# Patient Record
Sex: Male | Born: 1980 | Race: White | Hispanic: No | Smoking: Never smoker
Health system: Southern US, Community
[De-identification: ages and names within clinical notes are randomized; demographics above are authoritative.]

## PROBLEM LIST (undated history)

## (undated) DIAGNOSIS — I639 Cerebral infarction, unspecified: Secondary | ICD-10-CM

## (undated) DIAGNOSIS — I10 Essential (primary) hypertension: Secondary | ICD-10-CM

## (undated) HISTORY — PX: NO PAST SURGERIES: SHX2092

## (undated) HISTORY — DX: Cerebral infarction, unspecified: I63.9

---

## 2012-10-17 ENCOUNTER — Ambulatory Visit: Payer: Self-pay | Admitting: Family Medicine

## 2019-05-15 ENCOUNTER — Telehealth: Payer: Self-pay

## 2019-05-15 NOTE — Telephone Encounter (Signed)
Devesh came by for ibuprofen 21 packs with 2 tablets given (MooreBrand Ibuprofen 200 mg per tablet; Lot #[id number]; Exp: 10/12/2020)  2 Thermacare Heatwraps (Neck Pain Therapy) given. Lot #[id number]; Exp:  11/09/2020  AMD

## 2019-05-15 NOTE — Telephone Encounter (Signed)
Called C/O stiff neck for past couple of days- stating "feels like a slept on it wrong" .  Hasn't tried any OTC meds or anything else. Advised to try Ibuprofen 400 mg tid (with food); heat compress/heating pad for 15 minutes intermittently & warm showers.  Try these over the next 4-5 days & if no better, call back for an appointment for MD evaluation.  Verbalized understanding.  AMD

## 2019-08-21 ENCOUNTER — Emergency Department: Payer: 59

## 2019-08-21 ENCOUNTER — Encounter: Payer: Self-pay | Admitting: Emergency Medicine

## 2019-08-21 ENCOUNTER — Other Ambulatory Visit: Payer: Self-pay

## 2019-08-21 ENCOUNTER — Inpatient Hospital Stay (HOSPITAL_COMMUNITY)
Admission: AD | Admit: 2019-08-21 | Discharge: 2019-08-26 | DRG: 064 | Disposition: A | Discharge status: Home / Self Care | Payer: 59 | Source: Other Acute Inpatient Hospital | Attending: Neurology | Admitting: Neurology

## 2019-08-21 ENCOUNTER — Emergency Department
Admission: EM | Admit: 2019-08-21 | Discharge: 2019-08-21 | Disposition: A | Discharge status: Other Acute Inpatient Hospital | Payer: 59 | Attending: Emergency Medicine | Admitting: Emergency Medicine

## 2019-08-21 DIAGNOSIS — Z20828 Contact with and (suspected) exposure to other viral communicable diseases: Secondary | ICD-10-CM | POA: Diagnosis not present

## 2019-08-21 DIAGNOSIS — E782 Mixed hyperlipidemia: Secondary | ICD-10-CM | POA: Diagnosis not present

## 2019-08-21 DIAGNOSIS — Z6829 Body mass index (BMI) 29.0-29.9, adult: Secondary | ICD-10-CM | POA: Diagnosis not present

## 2019-08-21 DIAGNOSIS — I160 Hypertensive urgency: Secondary | ICD-10-CM | POA: Insufficient documentation

## 2019-08-21 DIAGNOSIS — R402252 Coma scale, best verbal response, oriented, at arrival to emergency department: Secondary | ICD-10-CM | POA: Diagnosis present

## 2019-08-21 DIAGNOSIS — R270 Ataxia, unspecified: Secondary | ICD-10-CM | POA: Diagnosis not present

## 2019-08-21 DIAGNOSIS — F17220 Nicotine dependence, chewing tobacco, uncomplicated: Secondary | ICD-10-CM | POA: Diagnosis present

## 2019-08-21 DIAGNOSIS — F1010 Alcohol abuse, uncomplicated: Secondary | ICD-10-CM | POA: Diagnosis present

## 2019-08-21 DIAGNOSIS — J3489 Other specified disorders of nose and nasal sinuses: Secondary | ICD-10-CM | POA: Diagnosis not present

## 2019-08-21 DIAGNOSIS — I639 Cerebral infarction, unspecified: Secondary | ICD-10-CM | POA: Diagnosis present

## 2019-08-21 DIAGNOSIS — E669 Obesity, unspecified: Secondary | ICD-10-CM | POA: Diagnosis not present

## 2019-08-21 DIAGNOSIS — I6389 Other cerebral infarction: Secondary | ICD-10-CM | POA: Diagnosis not present

## 2019-08-21 DIAGNOSIS — I161 Hypertensive emergency: Secondary | ICD-10-CM | POA: Diagnosis not present

## 2019-08-21 DIAGNOSIS — R29700 NIHSS score 0: Secondary | ICD-10-CM | POA: Diagnosis present

## 2019-08-21 DIAGNOSIS — R519 Headache, unspecified: Secondary | ICD-10-CM | POA: Diagnosis not present

## 2019-08-21 DIAGNOSIS — G936 Cerebral edema: Secondary | ICD-10-CM | POA: Diagnosis not present

## 2019-08-21 DIAGNOSIS — R402142 Coma scale, eyes open, spontaneous, at arrival to emergency department: Secondary | ICD-10-CM | POA: Diagnosis present

## 2019-08-21 DIAGNOSIS — I7774 Dissection of vertebral artery: Secondary | ICD-10-CM | POA: Diagnosis present

## 2019-08-21 DIAGNOSIS — R42 Dizziness and giddiness: Secondary | ICD-10-CM | POA: Diagnosis not present

## 2019-08-21 DIAGNOSIS — F17200 Nicotine dependence, unspecified, uncomplicated: Secondary | ICD-10-CM | POA: Diagnosis not present

## 2019-08-21 DIAGNOSIS — E7800 Pure hypercholesterolemia, unspecified: Secondary | ICD-10-CM | POA: Diagnosis not present

## 2019-08-21 DIAGNOSIS — I63431 Cerebral infarction due to embolism of right posterior cerebral artery: Secondary | ICD-10-CM | POA: Diagnosis not present

## 2019-08-21 DIAGNOSIS — E785 Hyperlipidemia, unspecified: Secondary | ICD-10-CM | POA: Diagnosis present

## 2019-08-21 DIAGNOSIS — I63211 Cerebral infarction due to unspecified occlusion or stenosis of right vertebral arteries: Secondary | ICD-10-CM | POA: Diagnosis not present

## 2019-08-21 DIAGNOSIS — R402362 Coma scale, best motor response, obeys commands, at arrival to emergency department: Secondary | ICD-10-CM | POA: Diagnosis present

## 2019-08-21 DIAGNOSIS — E538 Deficiency of other specified B group vitamins: Secondary | ICD-10-CM | POA: Diagnosis not present

## 2019-08-21 DIAGNOSIS — Z823 Family history of stroke: Secondary | ICD-10-CM | POA: Diagnosis not present

## 2019-08-21 DIAGNOSIS — I10 Essential (primary) hypertension: Secondary | ICD-10-CM | POA: Diagnosis not present

## 2019-08-21 DIAGNOSIS — I6300 Cerebral infarction due to thrombosis of unspecified precerebral artery: Secondary | ICD-10-CM | POA: Diagnosis not present

## 2019-08-21 DIAGNOSIS — R69 Illness, unspecified: Secondary | ICD-10-CM | POA: Diagnosis not present

## 2019-08-21 DIAGNOSIS — Z8249 Family history of ischemic heart disease and other diseases of the circulatory system: Secondary | ICD-10-CM

## 2019-08-21 DIAGNOSIS — I63233 Cerebral infarction due to unspecified occlusion or stenosis of bilateral carotid arteries: Secondary | ICD-10-CM | POA: Diagnosis not present

## 2019-08-21 HISTORY — DX: Essential (primary) hypertension: I10

## 2019-08-21 LAB — COMPREHENSIVE METABOLIC PANEL WITH GFR
ALT: 43 U/L (ref 0–44)
AST: 25 U/L (ref 15–41)
Albumin: 4.5 g/dL (ref 3.5–5.0)
Alkaline Phosphatase: 52 U/L (ref 38–126)
Anion gap: 11 (ref 5–15)
BUN: 19 mg/dL (ref 6–20)
CO2: 25 mmol/L (ref 22–32)
Calcium: 9.3 mg/dL (ref 8.9–10.3)
Chloride: 100 mmol/L (ref 98–111)
Creatinine, Ser: 1.18 mg/dL (ref 0.61–1.24)
GFR calc Af Amer: >60 mL/min
GFR calc non Af Amer: >60 mL/min
Glucose, Bld: 103 mg/dL — ABNORMAL HIGH (ref 70–99)
Potassium: 4.1 mmol/L (ref 3.5–5.1)
Sodium: 136 mmol/L (ref 135–145)
Total Bilirubin: 0.8 mg/dL (ref 0.3–1.2)
Total Protein: 7.6 g/dL (ref 6.5–8.1)

## 2019-08-21 LAB — TROPONIN I (HIGH SENSITIVITY)
Troponin I (High Sensitivity): 4 ng/L (ref <18)
Troponin I (High Sensitivity): 3 ng/L (ref <18)

## 2019-08-21 LAB — CBC
HCT: 42.5 % (ref 39.0–52.0)
Hemoglobin: 14.8 g/dL (ref 13.0–17.0)
MCH: 30.3 pg (ref 26.0–34.0)
MCHC: 34.8 g/dL (ref 30.0–36.0)
MCV: 86.9 fL (ref 80.0–100.0)
Platelets: 301 10*3/uL (ref 150–400)
RBC: 4.89 MIL/uL (ref 4.22–5.81)
RDW: 12.3 % (ref 11.5–15.5)
WBC: 8.4 10*3/uL (ref 4.0–10.5)
nRBC: 0 % (ref 0.0–0.2)

## 2019-08-21 LAB — HEMOGLOBIN A1C
Hgb A1c MFr Bld: 5.9 % — ABNORMAL HIGH (ref 4.8–5.6)
Mean Plasma Glucose: 122.63 mg/dL

## 2019-08-21 LAB — LIPID PANEL
Cholesterol: 257 mg/dL — ABNORMAL HIGH (ref 0–200)
HDL: 42 mg/dL
LDL Cholesterol: 180 mg/dL — ABNORMAL HIGH (ref 0–99)
Total CHOL/HDL Ratio: 6.1 ratio
Triglycerides: 176 mg/dL — ABNORMAL HIGH
VLDL: 35 mg/dL (ref 0–40)

## 2019-08-21 LAB — POC SARS CORONAVIRUS 2 AG: SARS Coronavirus 2 Ag: NEGATIVE

## 2019-08-21 MED ORDER — ACETAMINOPHEN 650 MG RE SUPP: 650.0000 mg | RECTAL | Status: DC | PRN

## 2019-08-21 MED ORDER — OXYCODONE HCL 5 MG PO TABS: 5.0000 mg | ORAL_TABLET | ORAL | Status: DC | PRN

## 2019-08-21 MED ORDER — ASPIRIN 300 MG RE SUPP: 300.0000 mg | Freq: Every day | RECTAL | Status: DC

## 2019-08-21 MED ORDER — LABETALOL HCL 5 MG/ML IV SOLN
5.0000 mg | INTRAVENOUS | Status: DC | PRN
  Administered 2019-08-21: 5 mg via INTRAVENOUS
  Filled 2019-08-21: qty 4

## 2019-08-21 MED ORDER — SODIUM CHLORIDE 0.9 % IV SOLN: INTRAVENOUS | Status: DC

## 2019-08-21 MED ORDER — THIAMINE HCL 100 MG/ML IJ SOLN: 100.0000 mg | Freq: Every day | INTRAMUSCULAR | Status: DC

## 2019-08-21 MED ORDER — ACETAMINOPHEN 160 MG/5ML PO SOLN: 650.0000 mg | ORAL | Status: DC | PRN

## 2019-08-21 MED ORDER — STROKE: EARLY STAGES OF RECOVERY BOOK
Freq: Once | Status: AC
  Administered 2019-08-22
  Filled 2019-08-21: qty 1

## 2019-08-21 MED ORDER — ASPIRIN EC 325 MG PO TBEC: 975.0000 mg | DELAYED_RELEASE_TABLET | Freq: Every day | ORAL | Status: DC

## 2019-08-21 MED ORDER — ACETAMINOPHEN 325 MG PO TABS
650.0000 mg | ORAL_TABLET | ORAL | Status: DC | PRN
  Administered 2019-08-23 (×2): 650 mg via ORAL
  Filled 2019-08-21 (×2): qty 2

## 2019-08-21 MED ORDER — LORAZEPAM 1 MG PO TABS: 1.0000 mg | ORAL_TABLET | ORAL | Status: DC | PRN

## 2019-08-21 MED ORDER — FOLIC ACID 1 MG PO TABS
1.0000 mg | ORAL_TABLET | Freq: Every day | ORAL | Status: DC
  Administered 2019-08-21: 1 mg via ORAL
  Filled 2019-08-21: qty 1

## 2019-08-21 MED ORDER — ASPIRIN EC 325 MG PO TBEC: 325.0000 mg | DELAYED_RELEASE_TABLET | Freq: Every day | ORAL | Status: DC

## 2019-08-21 MED ORDER — SODIUM CHLORIDE 3 % IV SOLN
INTRAVENOUS | Status: AC
  Administered 2019-08-22 (×3): 75 mL/h via INTRAVENOUS
  Filled 2019-08-21 (×5): qty 500

## 2019-08-21 MED ORDER — GADOBUTROL 1 MMOL/ML IV SOLN
7.5000 mL | Freq: Once | INTRAVENOUS | Status: AC | PRN
  Administered 2019-08-21: 7.5 mL via INTRAVENOUS
  Filled 2019-08-21: qty 7.5

## 2019-08-21 MED ORDER — ASPIRIN 81 MG PO CHEW
324.0000 mg | CHEWABLE_TABLET | Freq: Once | ORAL | Status: AC
  Administered 2019-08-21: 324 mg via ORAL
  Filled 2019-08-21: qty 4

## 2019-08-21 MED ORDER — ASPIRIN 325 MG PO TABS
325.0000 mg | ORAL_TABLET | Freq: Every day | ORAL | Status: DC
  Administered 2019-08-22 – 2019-08-26 (×5): 325 mg via ORAL
  Filled 2019-08-21 (×5): qty 1

## 2019-08-21 MED ORDER — VITAMIN B-1 100 MG PO TABS
100.0000 mg | ORAL_TABLET | Freq: Every day | ORAL | Status: DC
  Administered 2019-08-21: 100 mg via ORAL
  Filled 2019-08-21: qty 1

## 2019-08-21 MED ORDER — AMLODIPINE BESYLATE 5 MG PO TABS
2.5000 mg | ORAL_TABLET | Freq: Every day | ORAL | Status: DC
  Administered 2019-08-21: 2.5 mg via ORAL

## 2019-08-21 MED ORDER — LORAZEPAM 2 MG/ML IJ SOLN
1.0000 mg | INTRAMUSCULAR | Status: DC | PRN
  Administered 2019-08-21: 1 mg via INTRAVENOUS
  Filled 2019-08-21: qty 1

## 2019-08-21 MED ORDER — ATORVASTATIN CALCIUM 20 MG PO TABS
40.0000 mg | ORAL_TABLET | Freq: Every day | ORAL | Status: DC
  Administered 2019-08-21: 40 mg via ORAL
  Filled 2019-08-21: qty 2

## 2019-08-21 MED ORDER — ALPRAZOLAM 0.5 MG PO TABS
1.0000 mg | ORAL_TABLET | Freq: Once | ORAL | Status: AC
  Administered 2019-08-21: 1 mg via ORAL
  Filled 2019-08-21: qty 2

## 2019-08-21 MED ORDER — CLOPIDOGREL BISULFATE 75 MG PO TABS
75.0000 mg | ORAL_TABLET | Freq: Every day | ORAL | Status: DC
  Administered 2019-08-22 – 2019-08-26 (×5): 75 mg via ORAL
  Filled 2019-08-21 (×6): qty 1

## 2019-08-21 MED ORDER — CLOPIDOGREL BISULFATE 300 MG PO TABS
300.0000 mg | ORAL_TABLET | Freq: Once | ORAL | Status: AC
  Administered 2019-08-22: 300 mg via ORAL
  Filled 2019-08-21: qty 1

## 2019-08-21 MED ORDER — ADULT MULTIVITAMIN W/MINERALS CH
1.0000 | ORAL_TABLET | Freq: Every day | ORAL | Status: DC
  Administered 2019-08-21: 1 via ORAL
  Filled 2019-08-21: qty 1

## 2019-08-21 NOTE — ED Notes (Signed)
Late entry.  Swallow study done at 1140, prior to medication administration.

## 2019-08-21 NOTE — ED Provider Notes (Signed)
[facility] Emergency Department Provider Note  ____________________________________________   First MD Initiated Contact with Patient 08/21/19 1015     (approximate)  I have reviewed the triage vital signs and the nursing notes.   HISTORY  Chief Complaint Headache   HPI Henry Powell is a 38 y.o. male presents to the ED with complaint plaint of posterior and left-sided headache for approximately 3 days.  Patient states he has taken over-the-counter medication without any relief of his headache.  He denies any photophobia, visual disturbance, nausea, vomiting, chest pain or shortness of breath.  He states that his doctor discontinued his blood pressure medication 10 years ago when he was eating well and exercising on a routine basis.  Patient states he had a "dizzy spell" and was seen at [facility] urgent care on Sunday.  He has lab work with him and was told that he was okay and that his blood pressure came down while he was there.      Past Medical History:  Diagnosis Date  . Hypertension     Patient Active Problem List   Diagnosis Date Noted  . Cerebellar stroke, acute (HCC)   . Hypertensive urgency   . Alcohol abuse     Past Surgical History:  Procedure Laterality Date  . NO PAST SURGERIES      Prior to Admission medications   Not on File    Allergies Patient has no allergy information on record.  Family History  Problem Relation Age of Onset  . Hypertension Mother   . Diabetes Mother   . Hypertension Father     Social History Social History   Tobacco Use  . Smoking status: Never Smoker  . Smokeless tobacco: Current User    Types: Chew  Substance Use Topics  . Alcohol use: Yes    Alcohol/week: 56.0 standard drinks    Types: 56 Cans of beer per week  . Drug use: Never    Review of Systems Constitutional: No fever/chills Eyes: No visual changes. ENT: No sore throat. Cardiovascular: Denies chest pain. Respiratory: Denies  shortness of breath. Gastrointestinal: No abdominal pain.  No nausea, no vomiting. Musculoskeletal: Negative for back pain. Skin: Negative for rash. Neurological: Positive for headache, focal weakness or numbness. ____________________________________________   PHYSICAL EXAM:  VITAL SIGNS: ED Triage Vitals  Enc Vitals Group     BP 08/21/19 0958 (!) 184/114     Pulse Rate 08/21/19 0958 78     Resp 08/21/19 0958 18     Temp 08/21/19 0958 98.6 F (37 C)     Temp Source 08/21/19 0958 Oral     SpO2 08/21/19 0958 100 %     Weight 08/21/19 0955 185 lb (83.9 kg)     Height 08/21/19 0955 5\' 8"  (1.727 m)     Head Circumference --      Peak Flow --      Pain Score 08/21/19 0955 5     Pain Loc --      Pain Edu? --      Excl. in GC? --     Constitutional: Alert and oriented. Well appearing and in no acute distress.  Talkative and answering questions appropriately. Eyes: Conjunctivae are normal. PERRL. EOMI. Head: Atraumatic. Neck: No stridor.   Cardiovascular: Normal rate, regular rhythm. Grossly normal heart sounds.  Good peripheral circulation. Respiratory: Normal respiratory effort.  No retractions. Lungs CTAB. Musculoskeletal: Moves upper and lower extremities no difficulty.  Normal gait was noted.  Good muscle  strength bilaterally. Neurologic:  Normal speech and language. No gross focal neurologic deficits are appreciated.  Cranial nerves II through XII grossly intact.  No gait instability. Skin:  Skin is warm, dry and intact. No rash noted. Psychiatric: Mood and affect are normal. Speech and behavior are normal.  ____________________________________________   LABS (all labs ordered are listed, but only abnormal results are displayed)  Labs Reviewed  COMPREHENSIVE METABOLIC PANEL - Abnormal; Notable for the following components:      Result Value   Glucose, Bld 103 (*)    All other components within normal limits  SARS CORONAVIRUS 2 (TAT 6-24 HRS)  CBC  LIPID PANEL   HEMOGLOBIN A1C  TROPONIN I (HIGH SENSITIVITY)  TROPONIN I (HIGH SENSITIVITY)   ____________________________________________  EKG   ____________________________________________  RADIOLOGY  Official radiology report(s): Dg Chest 2 View  Result Date: 08/21/2019 CLINICAL DATA:  Hypertension. EXAM: CHEST - 2 VIEW COMPARISON:  None. FINDINGS: The cardiomediastinal silhouette is within normal limits. The lungs are well inflated and clear. There is no evidence of pleural effusion or pneumothorax. No acute osseous abnormality is identified. IMPRESSION: No active cardiopulmonary disease. Electronically Signed   By: Sebastian AcheAllen  Grady M.D.   On: 08/21/2019 13:58   Ct Head Wo Contrast  Result Date: 08/21/2019 CLINICAL DATA:  Headache and dizziness for 3-4 days. EXAM: CT HEAD WITHOUT CONTRAST TECHNIQUE: Contiguous axial images were obtained from the base of the skull through the vertex without intravenous contrast. COMPARISON:  None. FINDINGS: Brain: A round hypointense lesion in the right cerebellum measures 2.7 cm craniocaudal by 3.6 cm AP by 3.9 cm transverse. No hemorrhage is identified. There is no hydrocephalus or midline shift. Vascular: No hyperdense vessel or unexpected calcification. Skull: Intact.  No focal lesion. Sinuses/Orbits: None. Other: None. IMPRESSION: Lesion in the right cerebellum could be an infarct or a benign entity such as an arachnoid cyst or epidermoid cyst but cannot be definitively characterized. Brain MRI with and without contrast is recommended for further evaluation. These results were called by telephone at the time of interpretation on 08/21/2019 at 11:18 am to provider [facility] Mykael Trott, P.A., who verbally acknowledged these results. Electronically Signed   By: Drusilla Kannerhomas  Dalessio M.D.   On: 08/21/2019 11:23   Mr Laqueta JeanBrain W And Wo Contrast  Result Date: 08/21/2019 CLINICAL DATA:  Acute onset of headache and dizziness for 3 days. Abnormal head CT. EXAM: MRI HEAD WITHOUT AND WITH  CONTRAST TECHNIQUE: Multiplanar, multiecho pulse sequences of the brain and surrounding structures were obtained without and with intravenous contrast. CONTRAST:  7.755mL GADAVIST GADOBUTROL 1 MMOL/ML IV SOLN COMPARISON:  Head CT same day FINDINGS: Brain: There is acute infarction of the inferior right cerebellum in PICA distribution. The area of infarction shows swelling but there is no hemorrhage. Brain otherwise appears normal without evidence of other old small or large vessel infarction. No mass lesion, hydrocephalus or extra-axial collection. After contrast administration, no abnormal enhancement occurs. Vascular: Major vessels at the base of the brain show flow. Skull and upper cervical spine: Negative Sinuses/Orbits: Clear/normal.  Hypoplastic right maxillary sinus. Other: None IMPRESSION: Acute infarction affecting the inferior cerebellum on the right, PICA distribution. Swelling but no hemorrhagic transformation. Non dominant right vertebral artery appears to show at least some flow. Electronically Signed   By: Paulina FusiMark  Shogry M.D.   On: 08/21/2019 12:42    ____________________________________________   PROCEDURES  Procedure(s) performed (including Critical Care):  Procedures ____________________________________________   INITIAL IMPRESSION / ASSESSMENT AND PLAN / ED COURSE  As part of my medical decision making, I reviewed the following data within the electronic MEDICAL RECORD NUMBER Notes from prior ED visits and  Controlled Substance Database  38 year old male presents to the ED with complaint of left-sided headache for 3 days.  Patient states he has been taking Tylenol without any relief of his headache.  He denies any injury, previous headaches, visual disturbance, nausea, vomiting or photophobia.  He states he had "a dizzy spell" and was seen at [facility] urgent care 4 days ago and had lab work and was told he was okay.  Patient has not been on any antihypertensives in the last 10 years as  he lost weight and was exercising his doctor discontinued his medication.  Physical exam was unremarkable.  MRI showed an acute ischemic area with some edema involving the right inferior cerebellum.  Hospitalist and neurosurgery was consulted.  It was decided that patient would be transferred.  Dr. Kerman Passey currently is working on transfer.  ____________________________________________   FINAL CLINICAL IMPRESSION(S) / ED DIAGNOSES  Final diagnoses:  Ischemic stroke ([place])  Hypertension, uncontrolled     ED Discharge Orders    None       Note:  This document was prepared using Dragon voice recognition software and may include unintentional dictation errors.    Johnn Hai, PA-C 08/21/19 1509    Harvest Dark, MD 08/21/19 2048

## 2019-08-21 NOTE — ED Notes (Signed)
Patient is asking to eat; advised per this RN to remain NPO until MRI results are back.

## 2019-08-21 NOTE — ED Notes (Signed)
Return from CT.  AAOx3.  Skin warm and dry. NAD 

## 2019-08-21 NOTE — ED Notes (Signed)
Patient transported to MRI 

## 2019-08-21 NOTE — ED Triage Notes (Signed)
Pt hypertensive in triage. Pt reports was on BP meds a long time ago and then stopped taking them. Advised patient to follow up with PMD due to elevated BP

## 2019-08-21 NOTE — ED Notes (Signed)
Report called to Company secretary at [facility]. Patient to be transferred to room 22 by [facility].

## 2019-08-21 NOTE — ED Notes (Signed)
Neurology to bedside

## 2019-08-21 NOTE — H&P (Signed)
[place] [facility] [place] NAME: Henry Powell    MR#:  [id number]  DATE OF BIRTH:  [birth year]  DATE OF ADMISSION:  08/21/2019  PRIMARY CARE PHYSICIAN: [facility] [place]   REQUESTING/REFERRING PHYSICIAN: Dr Harvest Dark  CHIEF COMPLAINT:   Chief Complaint  Patient presents with  . Headache    HISTORY OF PRESENT ILLNESS:  Henry Powell  is a 38 y.o. male coming in with 4-day history of headache.  He was sweeping up some chemical at work he felt lightheaded and almost passed out.  He needed to sit down.  He has been sleeping a lot and resting over the last few days.  He went to the urgent care on Sunday because of dizziness and the headache persisting.  Today he bent over and tried to come back up and almost fell over.  He came to the ER for further evaluation.  In the ER he had a MRI that showed acute infarction affecting the inferior cerebellum on the right PICA distribution with swelling.  Hospitalist services were contacted for evaluation.  PAST MEDICAL HISTORY:   Past Medical History:  Diagnosis Date  . Hypertension     PAST SURGICAL HISTORY:   Past Surgical History:  Procedure Laterality Date  . NO PAST SURGERIES      SOCIAL HISTORY:   Social History   Tobacco Use  . Smoking status: Never Smoker  . Smokeless tobacco: Current User    Types: Chew  Substance Use Topics  . Alcohol use: Yes    Alcohol/week: 56.0 standard drinks    Types: 56 Cans of beer per week    FAMILY HISTORY:   Family History  Problem Relation Age of Onset  . Hypertension Mother   . Diabetes Mother   . Hypertension Father     DRUG ALLERGIES:  Not on File  REVIEW OF SYSTEMS:  CONSTITUTIONAL: No fever, chills or sweats.  Positive for fatigue and generalized weakness.  EYES: No blurred or double vision.  EARS, NOSE, AND THROAT: No tinnitus or ear pain. No sore throat.  Positive for nasal congestion RESPIRATORY: No cough.some  shortness of breath, no wheezing or hemoptysis.  CARDIOVASCULAR: Occasional chest pain. no orthopnea, edema.  GASTROINTESTINAL: No nausea, vomiting, diarrhea or abdominal pain. No blood in bowel movements GENITOURINARY: No dysuria, hematuria.  ENDOCRINE: No polyuria, nocturia,  HEMATOLOGY: No anemia, easy bruising or bleeding SKIN: No rash or lesion. MUSCULOSKELETAL: No joint pain or arthritis.   NEUROLOGIC: No tingling, numbness, weakness.  PSYCHIATRY: No anxiety or depression.   MEDICATIONS AT HOME:   Prior to Admission medications   Not on File   Patient does not take any medications  VITAL SIGNS:  Blood pressure (!) 150/119, pulse 71, temperature 98.6 F (37 C), temperature source Oral, resp. rate 16, height 5\' 8"  (1.727 m), weight 83.9 kg, SpO2 100 %.  PHYSICAL EXAMINATION:  GENERAL:  38 y.o.-year-old patient lying in the bed with no acute distress.  EYES: Pupils equal, round, reactive to light and accommodation. No scleral icterus. Extraocular muscles intact.  HEENT: Head atraumatic, normocephalic. Oropharynx and nasopharynx clear.  NECK:  Supple, no jugular venous distention. No thyroid enlargement, no tenderness.  LUNGS: Normal breath sounds bilaterally, no wheezing, rales,rhonchi or crepitation. No use of accessory muscles of respiration.  CARDIOVASCULAR: S1, S2 normal. No murmurs, rubs, or gallops.  ABDOMEN: Soft, nontender, nondistended. Bowel sounds present. No organomegaly or mass.  EXTREMITIES: No pedal edema, cyanosis, or clubbing.  NEUROLOGIC:  Cranial nerves II through XII are intact. Muscle strength 5/5 in all extremities. Sensation intact. Gait not checked.  Finger-nose intact bilaterally.  Heel shin intact bilaterally. PSYCHIATRIC: The patient is alert and oriented x 3.  SKIN: No rash, lesion, or ulcer.   LABORATORY PANEL:   CBC Recent Labs  Lab 08/21/19 1333  WBC 8.4  HGB 14.8  HCT 42.5  PLT 301    ------------------------------------------------------------------------------------------------------------------  Chemistries  Recent Labs  Lab 08/21/19 1333  NA 136  K 4.1  CL 100  CO2 25  GLUCOSE 103*  BUN 19  CREATININE 1.18  CALCIUM 9.3  AST 25  ALT 43  ALKPHOS 52  BILITOT 0.8   ------------------------------------------------------------------------------------------------------------------    RADIOLOGY:  Dg Chest 2 View  Result Date: 08/21/2019 CLINICAL DATA:  Hypertension. EXAM: CHEST - 2 VIEW COMPARISON:  None. FINDINGS: The cardiomediastinal silhouette is within normal limits. The lungs are well inflated and clear. There is no evidence of pleural effusion or pneumothorax. No acute osseous abnormality is identified. IMPRESSION: No active cardiopulmonary disease. Electronically Signed   By: Sebastian AcheAllen  Grady M.D.   On: 08/21/2019 13:58   Ct Head Wo Contrast  Result Date: 08/21/2019 CLINICAL DATA:  Headache and dizziness for 3-4 days. EXAM: CT HEAD WITHOUT CONTRAST TECHNIQUE: Contiguous axial images were obtained from the base of the skull through the vertex without intravenous contrast. COMPARISON:  None. FINDINGS: Brain: A round hypointense lesion in the right cerebellum measures 2.7 cm craniocaudal by 3.6 cm AP by 3.9 cm transverse. No hemorrhage is identified. There is no hydrocephalus or midline shift. Vascular: No hyperdense vessel or unexpected calcification. Skull: Intact.  No focal lesion. Sinuses/Orbits: None. Other: None. IMPRESSION: Lesion in the right cerebellum could be an infarct or a benign entity such as an arachnoid cyst or epidermoid cyst but cannot be definitively characterized. Brain MRI with and without contrast is recommended for further evaluation. These results were called by telephone at the time of interpretation on 08/21/2019 at 11:18 am to provider [facility] SUMMERS, P.A., who verbally acknowledged these results. Electronically Signed   By: Drusilla Kannerhomas   Dalessio M.D.   On: 08/21/2019 11:23   Mr Laqueta JeanBrain W And Wo Contrast  Result Date: 08/21/2019 CLINICAL DATA:  Acute onset of headache and dizziness for 3 days. Abnormal head CT. EXAM: MRI HEAD WITHOUT AND WITH CONTRAST TECHNIQUE: Multiplanar, multiecho pulse sequences of the brain and surrounding structures were obtained without and with intravenous contrast. CONTRAST:  7.575mL GADAVIST GADOBUTROL 1 MMOL/ML IV SOLN COMPARISON:  Head CT same day FINDINGS: Brain: There is acute infarction of the inferior right cerebellum in PICA distribution. The area of infarction shows swelling but there is no hemorrhage. Brain otherwise appears normal without evidence of other old small or large vessel infarction. No mass lesion, hydrocephalus or extra-axial collection. After contrast administration, no abnormal enhancement occurs. Vascular: Major vessels at the base of the brain show flow. Skull and upper cervical spine: Negative Sinuses/Orbits: Clear/normal.  Hypoplastic right maxillary sinus. Other: None IMPRESSION: Acute infarction affecting the inferior cerebellum on the right, PICA distribution. Swelling but no hemorrhagic transformation. Non dominant right vertebral artery appears to show at least some flow. Electronically Signed   By: Paulina FusiMark  Shogry M.D.   On: 08/21/2019 12:42    EKG:   Ordered by me  IMPRESSION AND PLAN:   1.  Acute cerebellar CVA on the right with edema.  Case discussed with neurology.  He recommended tertiary care center evaluation.  Okay with aspirin for now.  Will send off hemoglobin A1c and lipid profile.  Start Lipitor.  Will also need echo posterior circulation imaging at some point.  Neurology to help with the ER physician set up transfer.  Case also discussed with Dr. Deberah Castle and Bjorn Loser taking care of the patient in the emergency room. 2.  Hypertensive urgency.  Allow permissive hypertension.  IV labetalol for systolic blood pressure greater than 220 or diastolic blood pressure greater  than 120. 3.  Alcohol abuse.  We will put on alcohol withdrawal protocol.  No signs of withdrawal currently. 4.  Headache from stroke.  Tylenol as needed.  All the records are reviewed and case discussed with ED provider. Management plans discussed with the patient, family and they are in agreement.  CODE STATUS: Full code  TOTAL TIME TAKING CARE OF THIS PATIENT: 50 minutes.    Alford Highland M.D on 08/21/2019 at 2:35 PM  Between 7am to 6pm - Pager - [phone]  After 6pm call admission pager [phone]  [facility]  CC: Primary care physician; [facility], [facility]

## 2019-08-21 NOTE — ED Notes (Signed)
Report given to [facility]. 

## 2019-08-21 NOTE — ED Notes (Signed)
Dr. Kerman Passey notified of BP, no additional orders received at this time.  Patient is AAOx3.  Skin warm and dry.  MAE equally and strong.  NAD

## 2019-08-21 NOTE — ED Notes (Signed)
Dinner tray provided per patient request.

## 2019-08-21 NOTE — ED Notes (Addendum)
PA, Summers to bedside; pt and family provided update on plan of care.

## 2019-08-21 NOTE — ED Notes (Signed)
C/O posterior headache x 3 days.  States pain has lessened today.  AAOx3.  Skin warm and dry. NAD.  MAE equally and strong.  States rest makes improves pain.

## 2019-08-21 NOTE — ED Triage Notes (Signed)
Pt reports HA for 3 days and feels tired.

## 2019-08-21 NOTE — ED Notes (Signed)
EMTALA and Medical Necessity Documentation reviewed at this time and found to be complete per policy.

## 2019-08-21 NOTE — ED Notes (Signed)
Patient transported to X-ray 

## 2019-08-21 NOTE — ED Notes (Signed)
Hospitalist to bedside.

## 2019-08-21 NOTE — ED Notes (Signed)
Pt ambulatory to bathroom without difficulty.  

## 2019-08-21 NOTE — ED Provider Notes (Signed)
-----------------------------------------   3:20 PM on 08/21/2019 -----------------------------------------  I have personally seen and evaluated the patient.  Patient states started having dizziness and headache symptoms on Saturday, 5 days ago.  States the symptoms have actually improved since then, but they were urged by friend to come to the emergency department for evaluation.  Patient does have a family history of stroke his father's brother died of a stroke at 48 years old.  Patient denies any weakness or numbness of any arm or leg confusion or difficulty speaking.  NIH scale of 0 currently.  Dr. Irish Elders of neurology has seen the patient, he is concerned given the area of the infarct swelling could lead to occlusion of his fourth ventricle requiring urgent/emergent surgical intervention for decompression.  Given this possibility he request the patient be transferred to a tertiary care center, patient is requesting [facility].  We will discussed with [place] neurology for possible transfer.  Patient's basic lab work is largely [place].  Covid pending.  No Covid symptoms.   EKG viewed and interpreted by myself shows a normal sinus rhythm at 69 bpm with a narrow QRS, normal axis, normal intervals, no concerning ST changes.  NIH Stroke Scale   Interval: Baseline Time: 3:21 PM Person Administering Scale: Harvest Dark  Administer stroke scale items in the order listed. Record performance in each category after each subscale exam. Do not go back and change scores. Follow directions provided for each exam technique. Scores should reflect what the patient does, not what the clinician thinks the patient can do. The clinician should record answers while administering the exam and work quickly. Except where indicated, the patient should not be coached (i.e., repeated requests to patient to make a special effort).   1a  Level of consciousness: 0=alert; keenly responsive  1b. LOC questions:   0=Performs both tasks correctly  1c. LOC commands: 0=Performs both tasks correctly  2.  Best Gaze: 0=normal  3.  Visual: 0=No visual loss  4. Facial Palsy: 0=Normal symmetric movement  5a.  Motor left arm: 0=No drift, limb holds 90 (or 45) degrees for full 10 seconds  5b.  Motor right arm: 0=No drift, limb holds 90 (or 45) degrees for full 10 seconds  6a. motor left leg: 0=No drift, limb holds 90 (or 45) degrees for full 10 seconds  6b  Motor right leg:  0=No drift, limb holds 90 (or 45) degrees for full 10 seconds  7. Limb Ataxia: 0=Absent  8.  Sensory: 0=Normal; no sensory loss  9. Best Language:  0=No aphasia, normal  10. Dysarthria: 0=Normal  11. Extinction and Inattention: 0=No abnormality  12. Distal motor function: 0=Normal   Total:   0     Harvest Dark, MD 08/21/19 2203

## 2019-08-21 NOTE — Consult Note (Signed)
Reason for Consult: headache Referring Physician: Dr. Kerman Passey  CC: Headache   HPI: Henry Powell is an 38 y.o. male with hx of ETOH use no other medical problems. States for the past 3-4 days has been having dull pressure headache after which he was noted to be bumping into objects.  On examination he only has minor dysmetria with NIHSS of 1 on R side.   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    Past Medical History:  Diagnosis Date  . Hypertension     Past Surgical History:  Procedure Laterality Date  . NO PAST SURGERIES      Family History  Problem Relation Age of Onset  . Hypertension Mother   . Diabetes Mother   . Hypertension Father     Social History:  reports that he has never smoked. His smokeless tobacco use includes chew. He reports current alcohol use of about 56.0 standard drinks of alcohol per week. He reports that he does not use drugs.  Not on File  Medications: I have reviewed the patient's current medications.  ROS: History obtained from the patient  General ROS: negative for - chills, fatigue, fever, night sweats, weight gain or weight loss Psychological ROS: negative for - behavioral disorder, hallucinations, memory difficulties, mood swings or suicidal ideation Ophthalmic ROS: negative for - blurry vision, double vision, eye pain or loss of vision ENT ROS: negative for - epistaxis, nasal discharge, oral lesions, sore throat, tinnitus or vertigo Allergy and Immunology ROS: negative for - hives or itchy/watery eyes Hematological and Lymphatic ROS: negative for - bleeding problems, bruising or swollen lymph nodes Endocrine ROS: negative for - galactorrhea, hair pattern changes, polydipsia/polyuria or temperature intolerance Respiratory ROS: negative for - cough, hemoptysis, shortness of breath or wheezing Cardiovascular ROS: negative for - chest pain, dyspnea on exertion, edema or  irregular heartbeat Gastrointestinal ROS: negative for - abdominal pain, diarrhea, hematemesis, nausea/vomiting or stool incontinence Genito-Urinary ROS: negative for - dysuria, hematuria, incontinence or urinary frequency/urgency Musculoskeletal ROS: negative for - joint swelling or muscular weakness Neurological ROS: as noted in HPI Dermatological ROS: negative for rash and skin lesion changes  Physical Examination: Blood pressure (!) 150/119, pulse 71, temperature 98.6 F (37 C), temperature source Oral, resp. rate 16, height 5\' 8"  (1.727 m), weight 83.9 kg, SpO2 100 %.    Neurological Examination   Mental Status: Alert, oriented, thought content appropriate.  Speech fluent without evidence of aphasia.  Able to follow 3 step commands without difficulty. Cranial Nerves: II: Discs flat bilaterally; Visual fields grossly normal, pupils equal, round, reactive to light and accommodation III,IV, VI: ptosis not present, extra-ocular motions intact bilaterally V,VII: smile symmetric, facial light touch sensation normal bilaterally VIII: hearing normal bilaterally IX,X: gag reflex present XI: bilateral shoulder shrug XII: midline tongue extension Motor: Right : Upper extremity   5/5    Left:     Upper extremity   5/5  Lower extremity   5/5     Lower extremity   5/5 Tone and bulk:normal tone throughout; no atrophy noted Sensory: Pinprick and light touch intact throughout, bilaterally Deep Tendon Reflexes: 2+ and symmetric throughout Plantars: Right: downgoing   Left: downgoing Cerebellar: Mild dysmetria R side.  Gait: not tested      Laboratory Studies:   Basic Metabolic Panel: Recent Labs  Lab 08/21/19 1333  NA 136  K 4.1  CL 100  CO2 25  GLUCOSE 103*  BUN  19  CREATININE 1.18  CALCIUM 9.3    Liver Function Tests: Recent Labs  Lab 08/21/19 1333  AST 25  ALT 43  ALKPHOS 52  BILITOT 0.8  PROT 7.6  ALBUMIN 4.5   No results for input(s): LIPASE, AMYLASE in the  last 168 hours. No results for input(s): AMMONIA in the last 168 hours.  CBC: Recent Labs  Lab 08/21/19 1333  WBC 8.4  HGB 14.8  HCT 42.5  MCV 86.9  PLT 301    Cardiac Enzymes: No results for input(s): CKTOTAL, CKMB, CKMBINDEX, TROPONINI in the last 168 hours.  BNP: Invalid input(s): POCBNP  CBG: No results for input(s): GLUCAP in the last 168 hours.  Microbiology: No results found for this or any previous visit.  Coagulation Studies: No results for input(s): LABPROT, INR in the last 72 hours.  Urinalysis: No results for input(s): COLORURINE, LABSPEC, PHURINE, GLUCOSEU, HGBUR, BILIRUBINUR, KETONESUR, PROTEINUR, UROBILINOGEN, NITRITE, LEUKOCYTESUR in the last 168 hours.  Invalid input(s): APPERANCEUR  Lipid Panel:  No results found for: CHOL, TRIG, HDL, CHOLHDL, VLDL, LDLCALC  HgbA1C: No results found for: HGBA1C  Urine Drug Screen:  No results found for: LABOPIA, COCAINSCRNUR, LABBENZ, AMPHETMU, THCU, LABBARB  Alcohol Level: No results for input(s): ETH in the last 168 hours.  Other results: EKG: normal EKG, normal sinus rhythm, unchanged from previous tracings.  Imaging: Dg Chest 2 View  Result Date: 08/21/2019 CLINICAL DATA:  Hypertension. EXAM: CHEST - 2 VIEW COMPARISON:  None. FINDINGS: The cardiomediastinal silhouette is within normal limits. The lungs are well inflated and clear. There is no evidence of pleural effusion or pneumothorax. No acute osseous abnormality is identified. IMPRESSION: No active cardiopulmonary disease. Electronically Signed   By: Sebastian Ache M.D.   On: 08/21/2019 13:58   Ct Head Wo Contrast  Result Date: 08/21/2019 CLINICAL DATA:  Headache and dizziness for 3-4 days. EXAM: CT HEAD WITHOUT CONTRAST TECHNIQUE: Contiguous axial images were obtained from the base of the skull through the vertex without intravenous contrast. COMPARISON:  None. FINDINGS: Brain: A round hypointense lesion in the right cerebellum measures 2.7 cm craniocaudal  by 3.6 cm AP by 3.9 cm transverse. No hemorrhage is identified. There is no hydrocephalus or midline shift. Vascular: No hyperdense vessel or unexpected calcification. Skull: Intact.  No focal lesion. Sinuses/Orbits: None. Other: None. IMPRESSION: Lesion in the right cerebellum could be an infarct or a benign entity such as an arachnoid cyst or epidermoid cyst but cannot be definitively characterized. Brain MRI with and without contrast is recommended for further evaluation. These results were called by telephone at the time of interpretation on 08/21/2019 at 11:18 am to provider [facility] SUMMERS, P.A., who verbally acknowledged these results. Electronically Signed   By: Drusilla Kanner M.D.   On: 08/21/2019 11:23   Mr Laqueta Jean And Wo Contrast  Result Date: 08/21/2019 CLINICAL DATA:  Acute onset of headache and dizziness for 3 days. Abnormal head CT. EXAM: MRI HEAD WITHOUT AND WITH CONTRAST TECHNIQUE: Multiplanar, multiecho pulse sequences of the brain and surrounding structures were obtained without and with intravenous contrast. CONTRAST:  7.7mL GADAVIST GADOBUTROL 1 MMOL/ML IV SOLN COMPARISON:  Head CT same day FINDINGS: Brain: There is acute infarction of the inferior right cerebellum in PICA distribution. The area of infarction shows swelling but there is no hemorrhage. Brain otherwise appears normal without evidence of other old small or large vessel infarction. No mass lesion, hydrocephalus or extra-axial collection. After contrast administration, no abnormal enhancement occurs. Vascular: Major vessels  at the base of the brain show flow. Skull and upper cervical spine: Negative Sinuses/Orbits: Clear/normal.  Hypoplastic right maxillary sinus. Other: None IMPRESSION: Acute infarction affecting the inferior cerebellum on the right, PICA distribution. Swelling but no hemorrhagic transformation. Non dominant right vertebral artery appears to show at least some flow. Electronically Signed   By: Paulina FusiMark  Shogry M.D.    On: 08/21/2019 12:42     Assessment/Plan:   38 y.o. male with hx of ETOH use no other medical problems. States for the past 3-4 days has been having dull pressure headache after which he was noted to be bumping into objects.  On examination he only has minor dysmetria with NIHSS of 1 on R side.   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    - No recent infections/sick contacts - Would obtain Covid testing - Due to size of stroke and edema in young patient would like to watch him in facility with surgical abilities for possible posterior decompression if needed - at this time he is 3 days in as he is likely close to peak of cerebral edema but there is still risk of 4th ventricle involvement  - Family prefers [facility], if not then [facility] transfer - He will likely need 3% at large facility.   - d/w team, pt and his mom 08/21/2019, 2:41 PM

## 2019-08-22 ENCOUNTER — Inpatient Hospital Stay (HOSPITAL_COMMUNITY): Payer: 59

## 2019-08-22 ENCOUNTER — Encounter (HOSPITAL_COMMUNITY): Payer: Self-pay | Admitting: Neurology

## 2019-08-22 DIAGNOSIS — I6389 Other cerebral infarction: Secondary | ICD-10-CM

## 2019-08-22 DIAGNOSIS — I63011 Cerebral infarction due to thrombosis of right vertebral artery: Secondary | ICD-10-CM

## 2019-08-22 DIAGNOSIS — I639 Cerebral infarction, unspecified: Principal | ICD-10-CM

## 2019-08-22 LAB — ECHOCARDIOGRAM COMPLETE
Height: 68 in
Weight: 3100.55 oz

## 2019-08-22 LAB — LIPID PANEL
Cholesterol: 232 mg/dL — ABNORMAL HIGH (ref 0–200)
HDL: 33 mg/dL — ABNORMAL LOW (ref >40)
LDL Cholesterol: 168 mg/dL — ABNORMAL HIGH (ref 0–99)
Total CHOL/HDL Ratio: 7 RATIO
Triglycerides: 153 mg/dL — ABNORMAL HIGH (ref <150)
VLDL: 31 mg/dL (ref 0–40)

## 2019-08-22 LAB — MRSA PCR SCREENING: MRSA by PCR: POSITIVE — AB

## 2019-08-22 LAB — HIV ANTIBODY (ROUTINE TESTING W REFLEX): HIV Screen 4th Generation wRfx: NONREACTIVE

## 2019-08-22 LAB — SODIUM
Sodium: 138 mmol/L (ref 135–145)
Sodium: 140 mmol/L (ref 135–145)
Sodium: 143 mmol/L (ref 135–145)
Sodium: 142 mmol/L (ref 135–145)

## 2019-08-22 LAB — ANTITHROMBIN III: AntiThromb III Func: 113 % (ref 75–120)

## 2019-08-22 LAB — HEMOGLOBIN A1C
Hgb A1c MFr Bld: 5.5 % (ref 4.8–5.6)
Mean Plasma Glucose: 111.15 mg/dL

## 2019-08-22 LAB — SARS CORONAVIRUS 2 (TAT 6-24 HRS): SARS Coronavirus 2: NEGATIVE

## 2019-08-22 MED ORDER — CHLORHEXIDINE GLUCONATE CLOTH 2 % EX PADS: 6.0000 | MEDICATED_PAD | Freq: Every day | CUTANEOUS | Status: DC

## 2019-08-22 MED ORDER — AMLODIPINE BESYLATE 5 MG PO TABS
5.0000 mg | ORAL_TABLET | Freq: Every day | ORAL | Status: DC
  Administered 2019-08-22 – 2019-08-26 (×5): 5 mg via ORAL
  Filled 2019-08-22 (×5): qty 1

## 2019-08-22 MED ORDER — IOHEXOL 350 MG/ML SOLN
75.0000 mL | Freq: Once | INTRAVENOUS | Status: AC | PRN
  Administered 2019-08-22: 75 mL via INTRAVENOUS

## 2019-08-22 MED ORDER — CLEVIDIPINE BUTYRATE 0.5 MG/ML IV EMUL
0.0000 mg/h | INTRAVENOUS | Status: DC
  Administered 2019-08-22: 19:00:00 1 mg/h via INTRAVENOUS
  Filled 2019-08-22: qty 50

## 2019-08-22 MED ORDER — CHLORHEXIDINE GLUCONATE CLOTH 2 % EX PADS
6.0000 | MEDICATED_PAD | Freq: Every day | CUTANEOUS | Status: DC
  Administered 2019-08-23: 6 via TOPICAL

## 2019-08-22 MED ORDER — MUPIROCIN 2 % EX OINT
1.0000 "application " | TOPICAL_OINTMENT | Freq: Two times a day (BID) | CUTANEOUS | Status: DC
  Administered 2019-08-22 – 2019-08-26 (×5): 1 via NASAL
  Filled 2019-08-22: qty 22

## 2019-08-22 MED ORDER — ATORVASTATIN CALCIUM 80 MG PO TABS
80.0000 mg | ORAL_TABLET | Freq: Every day | ORAL | Status: DC
  Administered 2019-08-22 – 2019-08-25 (×4): 80 mg via ORAL
  Filled 2019-08-22 (×4): qty 1

## 2019-08-22 MED ORDER — MUPIROCIN 2 % EX OINT: 1.0000 "application " | TOPICAL_OINTMENT | Freq: Two times a day (BID) | CUTANEOUS | Status: DC

## 2019-08-22 NOTE — Progress Notes (Signed)
STROKE TEAM PROGRESS NOTE   INTERVAL HISTORY I have personally reviewed history of presenting illness in detail with the patient, electronic medical records and imaging films in PACS.  I also spoke to his mother.  Patient developed sudden onset of dizziness while sweeping the floor.  He denied any injury, neck pain, previous motor vehicle accident or whiplash injury.  He has no prior history of strokes TIAs, DVT or pulmonary embolism.  There is family history of strokes in the 58s in 2 male members.  Vitals:   08/22/19 0600 08/22/19 0700 08/22/19 0800 08/22/19 0817  BP:  (!) 139/103 (!) 151/109   Pulse: 64 (!) 59 71   Resp: Temp:    98.2 F (36.8 C)  TempSrc:    Oral  SpO2: 98% 95% 98%   Weight:      Height:        CBC:  Recent Labs  Lab 08/21/19 1333  WBC 8.4  HGB 14.8  HCT 42.5  MCV 86.9  PLT 301    Basic Metabolic Panel:  Recent Labs  Lab 08/21/19 1333 08/22/19 0030 08/22/19 0541  NA 136 138 140  K 4.1  --   --   CL 100  --   --   CO2 25  --   --   GLUCOSE 103*  --   --   BUN 19  --   --   CREATININE 1.18  --   --   CALCIUM 9.3  --   --    Lipid Panel:     Component Value Date/Time   CHOL 232 (H) 08/22/2019 0541   TRIG 153 (H) 08/22/2019 0541   HDL 33 (L) 08/22/2019 0541   CHOLHDL 7.0 08/22/2019 0541   VLDL 31 08/22/2019 0541   LDLCALC 168 (H) 08/22/2019 0541   HgbA1c:  Lab Results  Component Value Date   HGBA1C 5.5 08/22/2019   Urine Drug Screen: No results found for: LABOPIA, COCAINSCRNUR, LABBENZ, AMPHETMU, THCU, LABBARB  Alcohol Level No results found for: ETH  IMAGING CT ANGIO HEAD W OR WO CONTRAST  Result Date: 08/22/2019 CLINICAL DATA:  PICA stroke EXAM: CT ANGIOGRAPHY HEAD AND NECK TECHNIQUE: Multidetector CT imaging of the head and neck was performed using the standard protocol during bolus administration of intravenous contrast. Multiplanar CT image reconstructions and MIPs were obtained to evaluate the vascular anatomy.  Carotid stenosis measurements (when applicable) are obtained utilizing NASCET criteria, using the distal internal carotid diameter as the denominator. CONTRAST:  75mL OMNIPAQUE IOHEXOL 350 MG/ML SOLN COMPARISON:  Head CT and brain MRI from yesterday FINDINGS: CTA NECK FINDINGS Aortic arch: Limited coverage is negative.  Three vessel branching. Right carotid system: Motion artifact at the level of the common carotid. ICA tortuosity without beading or dissection. Mild noncalcified atheromatous plaque at the ICA bulb. Left carotid system: Motion artifact at the level of the common carotid. Low-density atheromatous plaque at the ICA bulb without ulceration or flow limiting stenosis. Vertebral arteries: No proximal subclavian stenosis. The right vertebral artery is occluded from its origin with weak reconstitution at the distal V2 segment with faintly opacified vessel extending to the dura. Robust left vertebral artery which is smooth and widely patent to the dura. Skeleton: C7-T1 incomplete segmentation Other neck: Negative Upper chest: Clear Review of the MIP images confirms the above findings CTA HEAD FINDINGS Anterior circulation: Vessels are smooth and widely patent. No atheromatous changes or aneurysm. Posterior circulation: Attenuated right vertebral artery which  is patent to the basilar. Bilateral proximal PICA is enhancing, although subtly truncated on the right which correlates with the infarct. Posterior cerebral arteries are symmetric and robust the flowing. Venous sinuses: Unremarkable given contrast timing Anatomic variants: None significant Review of the MIP images confirms the above findings IMPRESSION: 1. 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 Given age, an underlying dissection is a strong consideration. No findings of fibromuscular dysplasia in the other vessels. 2. Mild atheromatous plaque at the ICA bulbs without stenosis or ulceration. 3. Stable extent  of right cerebellar infarct.  No hydrocephalus. Electronically Signed   By: Marnee SpringJonathon  Watts M.D.   On: 08/22/2019 05:25   DG Chest 2 View  Result Date: 08/21/2019 CLINICAL DATA:  Hypertension. EXAM: CHEST - 2 VIEW COMPARISON:  None. FINDINGS: The cardiomediastinal silhouette is within normal limits. The lungs are well inflated and clear. There is no evidence of pleural effusion or pneumothorax. No acute osseous abnormality is identified. IMPRESSION: No active cardiopulmonary disease. Electronically Signed   By: Sebastian AcheAllen  Grady M.D.   On: 08/21/2019 13:58   CT HEAD WO CONTRAST  Result Date: 08/22/2019 CLINICAL DATA:  Stroke follow-up EXAM: CT HEAD WITHOUT CONTRAST TECHNIQUE: Contiguous axial images were obtained from the base of the skull through the vertex without intravenous contrast. COMPARISON:  Brain MRI 08/21/2019. FINDINGS: Brain: There is no mass, hemorrhage or extra-axial collection. The size and configuration of the ventricles and extra-axial CSF spaces are normal. Subacute infarct of the right cerebellum is redemonstrated, unchanged in size compared to the recent MRI. Basal cisterns remain patent. Supratentorial brain is normal. Vascular: No abnormal hyperdensity of the major intracranial arteries or dural venous sinuses. No intracranial atherosclerosis. Skull: The visualized skull base, calvarium and extracranial soft tissues are normal. Sinuses/Orbits: No fluid levels or advanced mucosal thickening of the visualized paranasal sinuses. No mastoid or middle ear effusion. The orbits are normal. ASPECTS [facility]([place] Stroke Program Early CT Score) - Ganglionic level infarction (caudate, lentiform nuclei, internal capsule, insula, M1-M3 cortex): 7 - Supraganglionic infarction (M4-M6 cortex): 3 Total score (0-10 with 10 being normal): 10 IMPRESSION: 1. No acute hemorrhage. 2. Unchanged appearance of right cerebellar infarct. 3. ASPECTS is 10. 4. These results were communicated to Dr. Ritta SlotMcNeill Kirkpatrick at  1:29 am on 08/22/2019 by text page via the [facility] messaging system. 1. Electronically Signed   By: Deatra RobinsonKevin  Herman M.D.   On: 08/22/2019 03:16   CT Head Wo Contrast  Result Date: 08/21/2019 CLINICAL DATA:  Headache and dizziness for 3-4 days. EXAM: CT HEAD WITHOUT CONTRAST TECHNIQUE: Contiguous axial images were obtained from the base of the skull through the vertex without intravenous contrast. COMPARISON:  None. FINDINGS: Brain: A round hypointense lesion in the right cerebellum measures 2.7 cm craniocaudal by 3.6 cm AP by 3.9 cm transverse. No hemorrhage is identified. There is no hydrocephalus or midline shift. Vascular: No hyperdense vessel or unexpected calcification. Skull: Intact.  No focal lesion. Sinuses/Orbits: None. Other: None. IMPRESSION: Lesion in the right cerebellum could be an infarct or a benign entity such as an arachnoid cyst or epidermoid cyst but cannot be definitively characterized. Brain MRI with and without contrast is recommended for further evaluation. These results were called by telephone at the time of interpretation on 08/21/2019 at 11:18 am to provider [facility] who verbally acknowledged these results. Electronically Signed   By: Drusilla Kannerhomas  Dalessio M.D.   On: 08/21/2019 11:23   CT ANGIO NECK W OR WO  CONTRAST  Result Date: 08/22/2019 CLINICAL DATA:  PICA stroke EXAM: CT ANGIOGRAPHY HEAD AND NECK TECHNIQUE: Multidetector CT imaging of the head and neck was performed using the standard protocol during bolus administration of intravenous contrast. Multiplanar CT image reconstructions and MIPs were obtained to evaluate the vascular anatomy. Carotid stenosis measurements (when applicable) are obtained utilizing NASCET criteria, using the distal internal carotid diameter as the denominator. CONTRAST:  45mL OMNIPAQUE IOHEXOL 350 MG/ML SOLN COMPARISON:  Head CT and brain MRI from yesterday FINDINGS: CTA NECK FINDINGS Aortic arch: Limited coverage is negative.  Three vessel  branching. Right carotid system: Motion artifact at the level of the common carotid. ICA tortuosity without beading or dissection. Mild noncalcified atheromatous plaque at the ICA bulb. Left carotid system: Motion artifact at the level of the common carotid. Low-density atheromatous plaque at the ICA bulb without ulceration or flow limiting stenosis. Vertebral arteries: No proximal subclavian stenosis. The right vertebral artery is occluded from its origin with weak reconstitution at the distal V2 segment with faintly opacified vessel extending to the dura. Robust left vertebral artery which is smooth and widely patent to the dura. Skeleton: C7-T1 incomplete segmentation Other neck: Negative Upper chest: Clear Review of the MIP images confirms the above findings CTA HEAD FINDINGS Anterior circulation: Vessels are smooth and widely patent. No atheromatous changes or aneurysm. Posterior circulation: Attenuated right vertebral artery which is patent to the basilar. Bilateral proximal PICA is enhancing, although subtly truncated on the right which correlates with the infarct. Posterior cerebral arteries are symmetric and robust the flowing. Venous sinuses: Unremarkable given contrast timing Anatomic variants: None significant Review of the MIP images confirms the above findings IMPRESSION: 1. 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 Given age, an underlying dissection is a strong consideration. No findings of fibromuscular dysplasia in the other vessels. 2. Mild atheromatous plaque at the ICA bulbs without stenosis or ulceration. 3. Stable extent of right cerebellar infarct.  No hydrocephalus. Electronically Signed   By: Marnee Spring M.D.   On: 08/22/2019 05:25   MR Brain W and Wo Contrast  Result Date: 08/21/2019 CLINICAL DATA:  Acute onset of headache and dizziness for 3 days. Abnormal head CT. EXAM: MRI HEAD WITHOUT AND WITH CONTRAST TECHNIQUE: Multiplanar,  multiecho pulse sequences of the brain and surrounding structures were obtained without and with intravenous contrast. CONTRAST:  7.78mL GADAVIST GADOBUTROL 1 MMOL/ML IV SOLN COMPARISON:  Head CT same day FINDINGS: Brain: There is acute infarction of the inferior right cerebellum in PICA distribution. The area of infarction shows swelling but there is no hemorrhage. Brain otherwise appears normal without evidence of other old small or large vessel infarction. No mass lesion, hydrocephalus or extra-axial collection. After contrast administration, no abnormal enhancement occurs. Vascular: Major vessels at the base of the brain show flow. Skull and upper cervical spine: Negative Sinuses/Orbits: Clear/normal.  Hypoplastic right maxillary sinus. Other: None IMPRESSION: Acute infarction affecting the inferior cerebellum on the right, PICA distribution. Swelling but no hemorrhagic transformation. Non dominant right vertebral artery appears to show at least some flow. Electronically Signed   By: Paulina Fusi M.D.   On: 08/21/2019 12:42    PHYSICAL EXAM Pleasant mildly obese young Caucasian male not in distress. . Afebrile. Head is nontraumatic. Neck is supple without bruit.    Cardiac exam no murmur or gallop. Lungs are clear to auscultation. Distal pulses are well felt. Neurological Exam ;  Awake  Alert oriented x 3. Normal  speech and language.eye movements full without nystagmus.fundi were not visualized. Vision acuity and fields appear normal. Hearing is normal. Palatal movements are normal. Face symmetric. Tongue midline. Normal strength, tone, reflexes and coordination. Normal sensation. Gait deferred. NIHSS 0  Premorbid MRS 0 ASSESSMENT/PLAN [name] is a 38 y.o. male with history of diet controlled HTN presenting to [facility] with sudden onset dizziness on 08/17/2019 while sweeping.    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 Cyst  MRI w/w/o  R inferior cerebellar infarct R PCA distribution. R VA w/ some flow  CT head No hemorrhage. Stable R cerebellar infarct. ASPECTS 10.     CTA head & neck R VA occlusion ? Dissection. No FMD. Mild atherosclerosis B ICA bulbs. Stable R cerebellar infarct   2D Echo pending   TCD w/ bubble pending   LE dopplers pending   LDL 168  HgbA1c 5.5  SCDs ordered for VTE prophylaxis  No antithrombotic prior to admission, now on aspirin 325 mg daily and clopidogrel 75 mg daily following load. Continue DAPT x 3 months then aspirin alone  Therapy recommendations:  pending   Disposition:  pending   Risk for Cerebral Edema/ Induced Hypernatremia  Placed on 3% protocol  On 3% @ 75h via PIV  Na 135-138-140   Goal Na 150-155  Continue 3% overnight. Will not place PICC   Check NA q6h  Plan d/c 3% 12/11    Hypertensive Urgency  Highest BP 184/114  Home meds:  None, diet controlled  BP improving  . Permissive hypertension (OK if < 220/120) but gradually normalize in 5-7 days . Add Norvasc 5 mg daily . Long-term BP goal normotensive  Hyperlipidemia  Home meds:  No statin  Now on lipitor 80  LDL 168, goal < 70  Continue statin at discharge  Other Stroke Risk Factors  Smokeless tobacco use (chew)  ETOH use, advised to drink no more than 2 drink(s) a day  Overweight, Body mass index is 29.46 kg/m., recommend weight loss, diet and exercise as appropriate   famiy hx stroke (2 uncles died of stroke one at 110 one < 65)  Other Active Problems  Mildly elevated Cr 1.18. 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  Remains at risk for developing cytotoxic edema and obstructive hydrocephalus.  Continue hypertonic saline for at least 24 hours and repeat CT scan tomorrow and if stable will consider tapering and  discontinuing it.  Check echocardiogram, transcranial Doppler bubble study for PFO and hypercoagulable labs and vasculitic labs given his young age.  Aspirin and Plavix for 3 months.  Patient also appears to be at risk for sleep apnea given his body habitus and will benefit with consideration for possible participation in the sleep smart trial.  He was given written information to review and decide later.  Long discussion with patient and his mother at the bedside and answered questions. This patient is critically ill and at significant risk of neurological worsening, death and care requires constant monitoring of vital signs, hemodynamics,respiratory and cardiac monitoring, extensive review of multiple databases, frequent neurological assessment, discussion with family, other specialists and medical decision making of high complexity.I have made any additions or clarifications directly to the above note.This critical care time does not reflect procedure time, or teaching time  or supervisory time of PA/NP/Med Resident etc but could involve care discussion time.  I spent 35 minutes of neurocritical care time  in the care of  this patient.   Antony Contras, MD  To contact Stroke Continuity provider, please refer to [email or web address] After hours, contact General Neurology

## 2019-08-22 NOTE — H&P (Signed)
Neurology H&P  CC: Dizziness  History is obtained from: Patient  HPI: Henry Powell is a 38 y.o. male with a history of hypertension who has been controlling it with diet for the past several years who presents with dizziness that started 12/6.  He states that he was sweeping when he felt sudden onset of dizziness.  Since that time, he has noticed that he has been running into things and been slightly off balance.  He denies any nausea or vomiting or vertigo.  Due to continuing to not feel right, he sought care at [facility] where an MRI revealed a fairly large cerebellar stroke.   LKW: 12/6 tpa given?: No, outside of window   ROS: A complete ROS was performed and is negative except as noted in the HPI.   Past Medical History:  Diagnosis Date  . Hypertension      Family History  Problem Relation Age of Onset  . Hypertension Mother   . Diabetes Mother   . Hypertension Father      Social History:  reports that he has never smoked. His smokeless tobacco use includes chew. He reports current alcohol use of about 56.0 standard drinks of alcohol per week. He reports that he does not use drugs.   Prior to Admission medications   Not on File     Exam: Current vital signs: BP (!) 126/113   Pulse 69   Temp 98.3 F (36.8 C) (Oral)   Resp 17   Ht 5\' 8"  (1.727 m)   Wt 87.9 kg   SpO2 98%   BMI 29.46 kg/m    Physical Exam  Constitutional: Appears well-developed and well-nourished.  Psych: Affect appropriate to situation Eyes: No scleral injection HENT: No OP obstrucion Head: Normocephalic.  Cardiovascular: Normal rate and regular rhythm.  Respiratory: Effort normal and breath sounds normal to anterior ascultation GI: Soft.  No distension. There is no tenderness.  Skin: WDI  Neuro: Mental Status: Patient is awake, alert, oriented to person, place, month, year, and situation. Patient is able to give a clear and coherent history. No signs of  aphasia or neglect Cranial Nerves: II: Visual Fields are full. Pupils are equal, round, and reactive to light.   III,IV, VI: EOMI without ptosis or diploplia.  V: Facial sensation is symmetric to temperature VII: Facial movement is symmetric.  VIII: hearing is intact to voice X: Uvula elevates symmetrically XI: Shoulder shrug is symmetric. XII: tongue is midline without atrophy or fasciculations.  Motor: Tone is normal. Bulk is normal. 5/5 strength was present in all four extremities.  Sensory: Sensation is symmetric to light touch and temperature in the arms and legs. 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   Secondary Diagnosis: Hypertension Emergency (SBP > 180 or DBP > 120 & end organ damage) Cerebral edema  Impression: 38 year old male with cerebellar infarct who has been transferred to the neuro ICU for close monitoring given concern for edema.  I think that he should be reaching peak edema, however given the continuous location, I do think that starting him on 3% for little while to help bridge through the peak swelling would be reasonable.  I am doubtful, however, that he  will need decompressive [place].  Plan: -Start atorvastatin 80 mg daily- MRI, MRA  of the brain without contrast - Frequent neuro checks - Echocardiogram -CTA head neck - Prophylactic therapy-aspirin and Plavix for 3 weeks followed by monotherapy - Risk factor modification - Telemetry monitoring - PT consult, OT consult, Speech consult -3% normal saline for cerebellar edema - Stroke team to follow    This patient is critically ill and at significant risk of neurological worsening,  death and care requires constant monitoring of vital signs, hemodynamics,respiratory and cardiac monitoring, neurological assessment, discussion with family, other specialists and medical decision making of high complexity. I spent 45 minutes of neurocritical care time  in the care of  this patient. This was time spent independent of any time provided by nurse practitioner or PA.  Ritta Slot, MD [facility] [phone]  If 7pm- 7am, please page neurology on call as listed in AMION.

## 2019-08-22 NOTE — Evaluation (Addendum)
Physical Therapy Evaluation Patient Details Name: Henry Powell MRN: [id number] DOB: [birth year] Today's Date: 08/22/2019   History of Present Illness  38 yo male admitted to [facility] 12/9 with 3-day history of headaches, HTN, and dizziness. MRI reveals ischemic infarct of R inferior cerebellum, R PICA distribution, transferred to [facility]. PMH includes HTN, ETOH use.  Clinical Impression   Pt presents with [facility] strength and sensation post-CVA, some higher level dynamic standing balance difficulties, WFL coordination, severe HTN up to 175/125, and decreased activity tolerance. Pt to benefit from acute PT to address deficits. Pt ambulated unit without AD with min guard to supervision level of assist, required one period of min assist from PT/OT to recover balance with walking with horizontal head turns. Pt lives by himself and works full time for the city of [place], pt states his mother might be able to stay with him post-acutely but she is from out of town. No PT follow up recommended at this time. Pt educated on signs/symptoms of stroke for future prevention. PT to progress mobility as tolerated, and will continue to follow acutely.      Follow Up Recommendations No PT follow up;Supervision for mobility/OOB(No PT follow up currently indicated, PT educated pt on monitoring symptoms of dizziness and if they persist, neuro OPPT recommended)    Equipment Recommendations  None recommended by PT    Recommendations for Other Services       Precautions / Restrictions Precautions Precautions: Fall Precaution Comments: on 3% saline for edema Restrictions Weight Bearing Restrictions: No      Mobility  Bed Mobility Overal bed mobility: Modified Independent             General bed mobility comments: Increased time to rise with use of bedrails.  Transfers Overall transfer level: Needs assistance   Transfers: Sit to/from Stand Sit to Stand: Supervision         General transfer comment:  supervision for safety, WFL stand and no LOB or unsteadiness in static standing.  Ambulation/Gait Ambulation/Gait assistance: Supervision;Min guard;+2 safety/equipment Gait Distance (Feet): 200 Feet Assistive device: None Gait Pattern/deviations: Step-through pattern;Drifts right/left Gait velocity: WFL   General Gait Details: Supervision for safety, min guard for challenges to dynamic standing balance. Pt with one bout of scissoring and LOB with horizontal head turning, requiring pt self-steadying and min assist for steadying from PT/OT.  Stairs            Wheelchair Mobility    Modified Rankin (Stroke Patients Only) Modified Rankin (Stroke Patients Only) Pre-Morbid Rankin Score: No symptoms Modified Rankin: No significant disability     Balance Overall balance assessment: Needs assistance Sitting-balance support: No upper extremity supported Sitting balance-Leahy Scale: Normal     Standing balance support: No upper extremity supported;During functional activity Standing balance-Leahy Scale: Good Standing balance comment: Pt with LOB with horizontal head turning in hallway, accepts moderate challenge             High level balance activites: Backward walking;Direction changes;Head turns High Level Balance Comments: 180* turn and stop WFL, some difficulty with maintaining balance with backward walking, horizontal and vertical head turns. Improved steadiness with second trial of horizontal head turns             Pertinent Vitals/Pain Pain Assessment: 0-10 Pain Score: 0-No pain Pain Intervention(s): Monitored during session    Home Living Family/patient expects to be discharged to:: Private residence Living Arrangements: Alone Available Help at Discharge: Available PRN/intermittently;Family;Other (Comment)(mom lives out of town) Type of Home:  House Home Access: Stairs to enter Entrance Stairs-Rails: None Entrance Stairs-Number of Steps: 2 Home Layout: One  level Home Equipment: None      Prior Function Level of Independence: Independent         Comments: fully independent, working. [employer]; football coach     Hand Dominance   Dominant Hand: Right    Extremity/Trunk Assessment   Upper Extremity Assessment Upper Extremity Assessment: Defer to OT evaluation    Lower Extremity Assessment Lower Extremity Assessment: Overall WFL for tasks assessed    Cervical / Trunk Assessment Cervical / Trunk Assessment: Normal  Communication   Communication: No difficulties  Cognition Arousal/Alertness: Awake/alert Behavior During Therapy: WFL for tasks assessed/performed Overall Cognitive Status: Within Functional Limits for tasks assessed                                 General Comments: Pt is pleasant, funny, and conversant with PT/OT      General Comments General comments (skin integrity, edema, etc.): UE rebound test WFL, LE heel-to-shin WFL. HTN up to 175/125 immediately post-ambulation, RN aware.    Exercises     Assessment/Plan    PT Assessment Patient needs continued PT services  PT Problem List Decreased safety awareness;Decreased activity tolerance;Decreased balance       PT Treatment Interventions Gait training;Balance training;Functional mobility training;Neuromuscular re-education    PT Goals (Current goals can be found in the Care Plan section)  Acute Rehab PT Goals Patient Stated Goal: go home, get back to work PT Goal Formulation: With patient Time For Goal Achievement: 09/05/19 Potential to Achieve Goals: Good    Frequency Min 3X/week   Barriers to discharge        Co-evaluation PT/OT/SLP Co-Evaluation/Treatment: Yes Reason for Co-Treatment: For patient/therapist safety;To address functional/ADL transfers PT goals addressed during session: Mobility/safety with mobility         AM-PAC PT "6 Clicks" Mobility  Outcome Measure Help needed turning from your back to your side  while in a flat bed without using bedrails?: None Help needed moving from lying on your back to sitting on the side of a flat bed without using bedrails?: None Help needed moving to and from a bed to a chair (including a wheelchair)?: None Help needed standing up from a chair using your arms (e.g., wheelchair or bedside chair)?: None Help needed to walk in hospital room?: A Little Help needed climbing 3-5 steps with a railing? : A Little 6 Click Score: 22    End of Session   Activity Tolerance: Patient tolerated treatment well Patient left: in chair;with call bell/phone within reach;with family/visitor present(pt agrees to press call button and wait for assist prior to mobilizing to bathroom or back to bed) Nurse Communication: Mobility status PT Visit Diagnosis: Other symptoms and signs involving the nervous system (R29.898);Unsteadiness on feet (R26.81)    Time: 4287-6811 PT Time Calculation (min) (ACUTE ONLY): 37 min   Charges:   PT Evaluation $PT Eval Low Complexity: 1 Low         Kinneth Fujiwara E, PT Acute Rehabilitation Services Pager [phone]  Office [phone]  Lanay Zinda D Elonda Husky 08/22/2019, 12:32 PM

## 2019-08-22 NOTE — Evaluation (Signed)
Speech Language Pathology Evaluation Patient Details Name: Henry Powell MRN: [id number] DOB: [birth year] Today's Date: 08/22/2019 Time: 3825-0539 SLP Time Calculation (min) (ACUTE ONLY): 32 min  Problem List:  Patient Active Problem List   Diagnosis Date Noted  . Stroke (cerebrum) ([place]) 08/21/2019  . Cerebellar stroke, acute ([place])   . Hypertensive urgency   . Alcohol abuse    Past Medical History:  Past Medical History:  Diagnosis Date  . Hypertension    Past Surgical History:  Past Surgical History:  Procedure Laterality Date  . NO PAST SURGERIES     HPI:  38 yo male admitted to [facility] 12/9 with 3-day history of headaches, HTN, and dizziness. MRI reveals ischemic infarct of R inferior cerebellum, R PICA distribution, transferred to [facility]. PMH includes HTN, ETOH use.   Assessment / Plan / Recommendation Clinical Impression  Pt assessed with the Schmahmann syndrome/cerebellar cognitive affective scale, scoring WFL in all sub-areas.  Pt demonstrates normal higher-level attention, affect, recall and reasoning.  Reviewed results with pt and his mother.  No SLP f/u is warranted.      SLP Assessment  SLP Recommendation/Assessment: Patient does not need any further Speech Lanaguage Pathology Services SLP Visit Diagnosis: Cognitive communication deficit (R41.841)    Follow Up Recommendations  None    Frequency and Duration           SLP Evaluation Cognition  Overall Cognitive Status: Within Functional Limits for tasks assessed Arousal/Alertness: Awake/alert Orientation Level: Oriented X4 Attention: Alternating Alternating Attention: Appears intact Memory: Appears intact Awareness: Appears intact Problem Solving: Appears intact Executive Function: Reasoning;Self Monitoring Reasoning: Appears intact Self Monitoring: Appears intact Safety/Judgment: Appears intact       Comprehension  Auditory Comprehension Overall Auditory Comprehension: Appears within functional  limits for tasks assessed    Expression Expression Primary Mode of Expression: Verbal Verbal Expression Overall Verbal Expression: Appears within functional limits for tasks assessed Written Expression Dominant Hand: Right Written Expression: Not tested   Oral / Motor  Oral Motor/Sensory Function Overall Oral Motor/Sensory Function: Within functional limits Motor Speech Overall Motor Speech: Appears within functional limits for tasks assessed   GO                    Juan Quam Laurice 08/22/2019, 3:58 PM   Varetta Chavers L. Tivis Ringer, [place] Office number [phone] Pager [phone]

## 2019-08-22 NOTE — Progress Notes (Signed)
Spoke with Dr. Cheral Marker with concerns of patients blood pressure gradually increasing. Diastolic out of parameters. No PRNS orded. Blood pressure parameters modified and cleviprex gtt started. Patient educated and all questions answered, patient remains concerned with blood pressure. Please see eMAR for titration. Lianne Bushy RN BSN.

## 2019-08-22 NOTE — Progress Notes (Signed)
  Echocardiogram 2D Echocardiogram has been performed.  Jamarl Pew G Sherrel Shafer 08/22/2019, 2:04 PM

## 2019-08-22 NOTE — Evaluation (Addendum)
Occupational Therapy Evaluation Patient Details Name: Henry Powell MRN: [id number] DOB: 12/30/1980 Today's Date: 08/22/2019    History of Present Illness 38 yo male admitted to [facility] 12/9 with 3-day history of headaches, HTN, and dizziness. MRI reveals ischemic infarct of R inferior cerebellum, R PICA distribution, transferred to [facility]. PMH includes HTN, ETOH use.   Clinical Impression   PTA pt fully independent and working full time for city of [employer]. At time of eval, pt is mod I for bed mobility and supervision-mod I for transfers. He presents with the ability to complete BADL at mod I level as well. Pt does need safety cues to take time, be slow and careful to prevent falls considering higher level balance still impacted. Gave pt water cup to test dysmetria in the hall while walking without deficit. Finger to nose only slightly impaired, but overall functional. Proprioception, sensation, and strength in tact. Pt able to complete beyond household level of functional mobility. Will benefit from acute OT to continue to progress higher level balance into BADL/IADL considering very independent and functional baseline. No f/u OT needed. Will follow per POC listed below.    Follow Up Recommendations  No OT follow up    Equipment Recommendations  None recommended by OT    Recommendations for Other Services       Precautions / Restrictions Precautions Precautions: Fall Precaution Comments: on 3% saline for edema Restrictions Weight Bearing Restrictions: No      Mobility Bed Mobility Overal bed mobility: Modified Independent             General bed mobility comments: Increased time to rise with use of bedrails.  Transfers Overall transfer level: Needs assistance   Transfers: Sit to/from Stand Sit to Stand: Supervision         General transfer comment: supervision for safety, WFL stand and no LOB or unsteadiness in static standing. Progressing to mod I    Balance  Overall balance assessment: Needs assistance Sitting-balance support: No upper extremity supported Sitting balance-Leahy Scale: Normal     Standing balance support: No upper extremity supported;During functional activity Standing balance-Leahy Scale: Good Standing balance comment: Pt with LOB with horizontal head turning in hallway, accepts moderate challenge             High level balance activites: Backward walking;Direction changes;Head turns High Level Balance Comments: 180* turn and stop WFL, some difficulty with maintaining balance with backward walking, horizontal and vertical head turns. Improved steadiness with second trial of horizontal head turns           ADL either performed or assessed with clinical judgement   ADL Overall ADL's : Modified independent                                       General ADL Comments: pt able to complete BADL at mod I level, very close to baseline. Pt completing LB dressing and standing grooming without any physical assist needed     Vision Baseline Vision/History: No visual deficits Patient Visual Report: No change from baseline Vision Assessment?: Yes Eye Alignment: Within Functional Limits Ocular Range of Motion: Within Functional Limits Tracking/Visual Pursuits: Able to track stimulus in all quads without difficulty Convergence: Within functional limits Visual Fields: No apparent deficits Additional Comments: no deficits noted     Perception     Praxis      Pertinent Vitals/Pain Pain Assessment: No/denies  pain Pain Score: 0-No pain Pain Intervention(s): Monitored during session     Hand Dominance Right   Extremity/Trunk Assessment Upper Extremity Assessment Upper Extremity Assessment: Overall WFL for tasks assessed(negative for dysmetria, strength deficits, and coordination deficits)   Lower Extremity Assessment Lower Extremity Assessment: Defer to PT evaluation   Cervical / Trunk  Assessment Cervical / Trunk Assessment: Normal   Communication Communication Communication: No difficulties   Cognition Arousal/Alertness: Awake/alert Behavior During Therapy: WFL for tasks assessed/performed Overall Cognitive Status: Within Functional Limits for tasks assessed                                 General Comments: Pt is pleasant, funny, and conversant with PT/OT   General Comments  UE rebound test WFL, LE heel-to-shin WFL. HTN up to 175/125 immediately post-ambulation, RN aware.    Exercises     Shoulder Instructions      Home Living Family/patient expects to be discharged to:: Private residence Living Arrangements: Alone Available Help at Discharge: Available PRN/intermittently;Family;Other (Comment)(mom out of town) Type of Home: House Home Access: Stairs to enter Secretary/administrator of Steps: 2 Entrance Stairs-Rails: None Home Layout: One level     Bathroom Shower/Tub: Chief Strategy Officer: Standard     Home Equipment: None          Prior Functioning/Environment Level of Independence: Independent        Comments: fully independent, working. [employer]; football coach        OT Problem List: Decreased knowledge of use of DME or AE;Decreased coordination;Decreased activity tolerance;Impaired UE functional use;Impaired balance (sitting and/or standing)      OT Treatment/Interventions: Self-care/ADL training;Therapeutic exercise;Patient/family education;Neuromuscular education;Balance training;Therapeutic activities;DME and/or AE instruction    OT Goals(Current goals can be found in the care plan section) Acute Rehab OT Goals Patient Stated Goal: go home, get back to work OT Goal Formulation: With patient Time For Goal Achievement: 09/05/19 Potential to Achieve Goals: Good  OT Frequency: Min 2X/week   Barriers to D/C:            Co-evaluation PT/OT/SLP Co-Evaluation/Treatment: Yes Reason for  Co-Treatment: For patient/therapist safety;To address functional/ADL transfers PT goals addressed during session: Mobility/safety with mobility OT goals addressed during session: ADL's and self-care;Strengthening/ROM      AM-PAC OT "6 Clicks" Daily Activity     Outcome Measure Help from another person eating meals?: A Little Help from another person taking care of personal grooming?: None Help from another person toileting, which includes using toliet, bedpan, or urinal?: A Little Help from another person bathing (including washing, rinsing, drying)?: A Little Help from another person to put on and taking off regular upper body clothing?: None Help from another person to put on and taking off regular lower body clothing?: None 6 Click Score: 21   End of Session Equipment Utilized During Treatment: Gait belt Nurse Communication: Mobility status;Other (comment)(BP)  Activity Tolerance: Patient tolerated treatment well Patient left: in chair;with call bell/phone within reach;with family/visitor present  OT Visit Diagnosis: Other abnormalities of gait and mobility (R26.89);Other symptoms and signs involving the nervous system (R29.898)                Time: 6283-6629 OT Time Calculation (min): 37 min Charges:  OT General Charges $OT Visit: 1 Visit OT Evaluation $OT Eval Low Complexity: 1 Low  Henry Powell, MSOT, OTR/L Behavioral Health OT/ Acute Relief OT [facility] Office: [phone]  Henry HandingKaylee China Powell 08/22/2019, 2:10 PM

## 2019-08-23 ENCOUNTER — Inpatient Hospital Stay (HOSPITAL_COMMUNITY): Payer: 59

## 2019-08-23 ENCOUNTER — Encounter (HOSPITAL_COMMUNITY): Payer: Self-pay | Admitting: Neurology

## 2019-08-23 DIAGNOSIS — I639 Cerebral infarction, unspecified: Secondary | ICD-10-CM

## 2019-08-23 DIAGNOSIS — I6300 Cerebral infarction due to thrombosis of unspecified precerebral artery: Secondary | ICD-10-CM

## 2019-08-23 LAB — SODIUM
Sodium: 142 mmol/L (ref 135–145)
Sodium: 141 mmol/L (ref 135–145)
Sodium: 139 mmol/L (ref 135–145)

## 2019-08-23 LAB — BASIC METABOLIC PANEL
Anion gap: 8 (ref 5–15)
BUN: 14 mg/dL (ref 6–20)
CO2: 25 mmol/L (ref 22–32)
Calcium: 9.3 mg/dL (ref 8.9–10.3)
Chloride: 109 mmol/L (ref 98–111)
Creatinine, Ser: 1.22 mg/dL (ref 0.61–1.24)
GFR calc Af Amer: >60 mL/min (ref >60)
GFR calc non Af Amer: >60 mL/min (ref >60)
Glucose, Bld: 106 mg/dL — ABNORMAL HIGH (ref 70–99)
Potassium: 4.6 mmol/L (ref 3.5–5.1)
Sodium: 142 mmol/L (ref 135–145)

## 2019-08-23 LAB — CBC
HCT: 40.3 % (ref 39.0–52.0)
Hemoglobin: 13.9 g/dL (ref 13.0–17.0)
MCH: 30.8 pg (ref 26.0–34.0)
MCHC: 34.5 g/dL (ref 30.0–36.0)
MCV: 89.4 fL (ref 80.0–100.0)
Platelets: 278 10*3/uL (ref 150–400)
RBC: 4.51 MIL/uL (ref 4.22–5.81)
RDW: 12.4 % (ref 11.5–15.5)
WBC: 6.9 10*3/uL (ref 4.0–10.5)
nRBC: 0 % (ref 0.0–0.2)

## 2019-08-23 LAB — RPR: RPR Ser Ql: NONREACTIVE

## 2019-08-23 MED ORDER — HYDRALAZINE HCL 20 MG/ML IJ SOLN: 20.0000 mg | Freq: Three times a day (TID) | INTRAMUSCULAR | Status: DC | PRN

## 2019-08-23 MED ORDER — LABETALOL HCL 5 MG/ML IV SOLN: 20.0000 mg | INTRAVENOUS | Status: DC | PRN

## 2019-08-23 MED ORDER — SODIUM CHLORIDE 3 % IV SOLN
INTRAVENOUS | Status: AC
  Administered 2019-08-23: 05:00:00 75 mL/h via INTRAVENOUS
  Filled 2019-08-23 (×4): qty 500

## 2019-08-23 NOTE — Progress Notes (Signed)
TCD bubble study and lower extremity venous have been completed.   Preliminary results in CV Proc.   Abram Sander 08/23/2019 10:33 AM

## 2019-08-23 NOTE — Progress Notes (Signed)
STROKE TEAM PROGRESS NOTE   INTERVAL HISTORY Patient is lying comfortably in bed.  He complains of mild headache last night which responded with Tylenol.  He remains on hypertonic saline at 75 cc an hour with serum sodium was only 142 this morning. Repeat CT scan this morning shows stable appearance of the right PICA territory infarct with cytotoxic edema but no hydrocephalus.  Patient decided not to participate in the sleep smart stroke prevention study.  I personally performed transcranial Doppler bubble study at the bedside which was negative for right-to-left shunt Vitals:   08/23/19 0400 08/23/19 0430 08/23/19 0500 08/23/19 0530  BP: 127/73 106/83 129/86 (!) 143/104  Pulse: (!) 56 (!) 56 96 71  Resp: 14 14 16 17   Temp: 97.9 F (36.6 C)     TempSrc: Oral     SpO2: 96% 96% 99% 99%  Weight:      Height:        CBC:  Recent Labs  Lab 08/21/19 1333  WBC 8.4  HGB 14.8  HCT 42.5  MCV 86.9  PLT 672    Basic Metabolic Panel:  Recent Labs  Lab 08/21/19 1333 08/22/19 1828 08/23/19 0042  NA 136 142 142  K 4.1  --   --   CL 100  --   --   CO2 25  --   --   GLUCOSE 103*  --   --   BUN 19  --   --   CREATININE 1.18  --   --   CALCIUM 9.3  --   --    Lipid Panel:     Component Value Date/Time   CHOL 232 (H) 08/22/2019 0541   TRIG 153 (H) 08/22/2019 0541   HDL 33 (L) 08/22/2019 0541   CHOLHDL 7.0 08/22/2019 0541   VLDL 31 08/22/2019 0541   LDLCALC 168 (H) 08/22/2019 0541   HgbA1c:  Lab Results  Component Value Date   HGBA1C 5.5 08/22/2019   Urine Drug Screen: No results found for: LABOPIA, COCAINSCRNUR, LABBENZ, AMPHETMU, THCU, LABBARB  Alcohol Level No results found for: ETH  IMAGING  CT ANGIO HEAD W OR WO CONTRAST  Result Date: 08/22/2019 CLINICAL DATA:  PICA stroke EXAM: CT ANGIOGRAPHY HEAD AND NECK TECHNIQUE: Multidetector CT imaging of the head and neck was performed using the standard protocol during bolus administration of intravenous contrast.  Multiplanar CT image reconstructions and MIPs were obtained to evaluate the vascular anatomy. Carotid stenosis measurements (when applicable) are obtained utilizing NASCET criteria, using the distal internal carotid diameter as the denominator. CONTRAST:  63mL OMNIPAQUE IOHEXOL 350 MG/ML SOLN COMPARISON:  Head CT and brain MRI from yesterday FINDINGS: CTA NECK FINDINGS Aortic arch: Limited coverage is negative.  Three vessel branching. Right carotid system: Motion artifact at the level of the common carotid. ICA tortuosity without beading or dissection. Mild noncalcified atheromatous plaque at the ICA bulb. Left carotid system: Motion artifact at the level of the common carotid. Low-density atheromatous plaque at the ICA bulb without ulceration or flow limiting stenosis. Vertebral arteries: No proximal subclavian stenosis. The right vertebral artery is occluded from its origin with weak reconstitution at the distal V2 segment with faintly opacified vessel extending to the dura. Robust left vertebral artery which is smooth and widely patent to the dura. Skeleton: C7-T1 incomplete segmentation Other neck: Negative Upper chest: Clear Review of the MIP images confirms the above findings CTA HEAD FINDINGS Anterior circulation: Vessels are smooth and widely patent. No atheromatous changes or aneurysm.  Posterior circulation: Attenuated right vertebral artery which is patent to the basilar. Bilateral proximal PICA is enhancing, although subtly truncated on the right which correlates with the infarct. Posterior cerebral arteries are symmetric and robust the flowing. Venous sinuses: Unremarkable given contrast timing Anatomic variants: None significant Review of the MIP images confirms the above findings IMPRESSION: 1. 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 Given age, an underlying dissection is a strong consideration. No findings of fibromuscular dysplasia in the other vessels.  2. Mild atheromatous plaque at the ICA bulbs without stenosis or ulceration. 3. Stable extent of right cerebellar infarct.  No hydrocephalus. Electronically Signed   By: Marnee SpringJonathon  Watts M.D.   On: 08/22/2019 05:25   DG Chest 2 View  Result Date: 08/21/2019 CLINICAL DATA:  Hypertension. EXAM: CHEST - 2 VIEW COMPARISON:  None. FINDINGS: The cardiomediastinal silhouette is within normal limits. The lungs are well inflated and clear. There is no evidence of pleural effusion or pneumothorax. No acute osseous abnormality is identified. IMPRESSION: No active cardiopulmonary disease. Electronically Signed   By: Sebastian AcheAllen  Grady M.D.   On: 08/21/2019 13:58   CT HEAD WO CONTRAST  Result Date: 08/22/2019 CLINICAL DATA:  Stroke follow-up EXAM: CT HEAD WITHOUT CONTRAST TECHNIQUE: Contiguous axial images were obtained from the base of the skull through the vertex without intravenous contrast. COMPARISON:  Brain MRI 08/21/2019. FINDINGS: Brain: There is no mass, hemorrhage or extra-axial collection. The size and configuration of the ventricles and extra-axial CSF spaces are normal. Subacute infarct of the right cerebellum is redemonstrated, unchanged in size compared to the recent MRI. Basal cisterns remain patent. Supratentorial brain is normal. Vascular: No abnormal hyperdensity of the major intracranial arteries or dural venous sinuses. No intracranial atherosclerosis. Skull: The visualized skull base, calvarium and extracranial soft tissues are normal. Sinuses/Orbits: No fluid levels or advanced mucosal thickening of the visualized paranasal sinuses. No mastoid or middle ear effusion. The orbits are normal. ASPECTS [facility]([place] Stroke Program Early CT Score) - Ganglionic level infarction (caudate, lentiform nuclei, internal capsule, insula, M1-M3 cortex): 7 - Supraganglionic infarction (M4-M6 cortex): 3 Total score (0-10 with 10 being normal): 10 IMPRESSION: 1. No acute hemorrhage. 2. Unchanged appearance of right cerebellar  infarct. 3. ASPECTS is 10. 4. These results were communicated to Dr. Ritta SlotMcNeill Kirkpatrick at 1:29 am on 08/22/2019 by text page via the [facility] messaging system. 1. Electronically Signed   By: Deatra RobinsonKevin  Herman M.D.   On: 08/22/2019 03:16   CT Head Wo Contrast  Result Date: 08/21/2019 CLINICAL DATA:  Headache and dizziness for 3-4 days. EXAM: CT HEAD WITHOUT CONTRAST TECHNIQUE: Contiguous axial images were obtained from the base of the skull through the vertex without intravenous contrast. COMPARISON:  None. FINDINGS: Brain: A round hypointense lesion in the right cerebellum measures 2.7 cm craniocaudal by 3.6 cm AP by 3.9 cm transverse. No hemorrhage is identified. There is no hydrocephalus or midline shift. Vascular: No hyperdense vessel or unexpected calcification. Skull: Intact.  No focal lesion. Sinuses/Orbits: None. Other: None. IMPRESSION: Lesion in the right cerebellum could be an infarct or a benign entity such as an arachnoid cyst or epidermoid cyst but cannot be definitively characterized. Brain MRI with and without contrast is recommended for further evaluation. These results were called by telephone at the time of interpretation on 08/21/2019 at 11:18 am to provider [facility] Hospital[facility]ONDA SUMMERS, P.A., who verbally acknowledged these results. Electronically Signed   By: Drusilla Kannerhomas  Dalessio M.D.   On: 08/21/2019 11:23  CT ANGIO NECK W OR WO CONTRAST  Result Date: 08/22/2019 CLINICAL DATA:  PICA stroke EXAM: CT ANGIOGRAPHY HEAD AND NECK TECHNIQUE: Multidetector CT imaging of the head and neck was performed using the standard protocol during bolus administration of intravenous contrast. Multiplanar CT image reconstructions and MIPs were obtained to evaluate the vascular anatomy. Carotid stenosis measurements (when applicable) are obtained utilizing NASCET criteria, using the distal internal carotid diameter as the denominator. CONTRAST:  62mL OMNIPAQUE IOHEXOL 350 MG/ML SOLN COMPARISON:  Head CT and brain MRI from  yesterday FINDINGS: CTA NECK FINDINGS Aortic arch: Limited coverage is negative.  Three vessel branching. Right carotid system: Motion artifact at the level of the common carotid. ICA tortuosity without beading or dissection. Mild noncalcified atheromatous plaque at the ICA bulb. Left carotid system: Motion artifact at the level of the common carotid. Low-density atheromatous plaque at the ICA bulb without ulceration or flow limiting stenosis. Vertebral arteries: No proximal subclavian stenosis. The right vertebral artery is occluded from its origin with weak reconstitution at the distal V2 segment with faintly opacified vessel extending to the dura. Robust left vertebral artery which is smooth and widely patent to the dura. Skeleton: C7-T1 incomplete segmentation Other neck: Negative Upper chest: Clear Review of the MIP images confirms the above findings CTA HEAD FINDINGS Anterior circulation: Vessels are smooth and widely patent. No atheromatous changes or aneurysm. Posterior circulation: Attenuated right vertebral artery which is patent to the basilar. Bilateral proximal PICA is enhancing, although subtly truncated on the right which correlates with the infarct. Posterior cerebral arteries are symmetric and robust the flowing. Venous sinuses: Unremarkable given contrast timing Anatomic variants: None significant Review of the MIP images confirms the above findings IMPRESSION: 1. 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 Given age, an underlying dissection is a strong consideration. No findings of fibromuscular dysplasia in the other vessels. 2. Mild atheromatous plaque at the ICA bulbs without stenosis or ulceration. 3. Stable extent of right cerebellar infarct.  No hydrocephalus. Electronically Signed   By: Marnee Spring M.D.   On: 08/22/2019 05:25   MR Brain W and Wo Contrast  Result Date: 08/21/2019 CLINICAL DATA:  Acute onset of headache and dizziness for 3  days. Abnormal head CT. EXAM: MRI HEAD WITHOUT AND WITH CONTRAST TECHNIQUE: Multiplanar, multiecho pulse sequences of the brain and surrounding structures were obtained without and with intravenous contrast. CONTRAST:  7.36mL GADAVIST GADOBUTROL 1 MMOL/ML IV SOLN COMPARISON:  Head CT same day FINDINGS: Brain: There is acute infarction of the inferior right cerebellum in PICA distribution. The area of infarction shows swelling but there is no hemorrhage. Brain otherwise appears normal without evidence of other old small or large vessel infarction. No mass lesion, hydrocephalus or extra-axial collection. After contrast administration, no abnormal enhancement occurs. Vascular: Major vessels at the base of the brain show flow. Skull and upper cervical spine: Negative Sinuses/Orbits: Clear/normal.  Hypoplastic right maxillary sinus. Other: None IMPRESSION: Acute infarction affecting the inferior cerebellum on the right, PICA distribution. Swelling but no hemorrhagic transformation. Non dominant right vertebral artery appears to show at least some flow. Electronically Signed   By: Paulina Fusi M.D.   On: 08/21/2019 12:42   ECHOCARDIOGRAM COMPLETE  Result Date: 08/22/2019   ECHOCARDIOGRAM REPORT   Patient Name:   Henry Powell Date of Exam: 08/22/2019 Medical Rec #:  [id number]     Height:       68.0 in Accession #:    [id number]  Weight:       193.8 lb Date of Birth:  03/25/1981     BSA:          2.02 m Patient Age:    38 years      BP:           148/107 mmHg Patient Gender: M             HR:           78 bpm. Exam Location:  Inpatient Procedure: 2D Echo, Cardiac Doppler and Color Doppler Indications:    Stroke 434.91 I163.9  History:        Patient has no prior history of Echocardiogram examinations.                 Risk Factors:Hypertension.  Sonographer:    Elmarie Shiley Dance Referring Phys: Tara.Kingdom MCNEILL P KIRKPATRICK IMPRESSIONS  1. Left ventricular ejection fraction, by visual estimation, is 55 to 60%. The left  ventricle has normal function. There is no left ventricular hypertrophy.  2. Left ventricular diastolic parameters are consistent with Grade I diastolic dysfunction (impaired relaxation).  3. The left ventricle has no regional wall motion abnormalities.  4. Global right ventricle has normal systolic function.The right ventricular size is normal.  5. Left atrial size was normal.  6. Right atrial size was normal.  7. The mitral valve is normal in structure. No evidence of mitral valve regurgitation. No evidence of mitral stenosis.  8. The tricuspid valve is normal in structure. Tricuspid valve regurgitation is trivial.  9. The aortic valve is normal in structure. Aortic valve regurgitation is not visualized. No evidence of aortic valve sclerosis or stenosis. 10. The pulmonic valve was normal in structure. Pulmonic valve regurgitation is trivial. 11. The inferior vena cava is normal in size with greater than 50% respiratory variability, suggesting right atrial pressure of 3 mmHg. 12. Normal LV systolic function; grade 1 diastolic dysfunction. FINDINGS  Left Ventricle: Left ventricular ejection fraction, by visual estimation, is 55 to 60%. The left ventricle has normal function. The left ventricle has no regional wall motion abnormalities. There is no left ventricular hypertrophy. Left ventricular diastolic parameters are consistent with Grade I diastolic dysfunction (impaired relaxation). Normal left atrial pressure. Right Ventricle: The right ventricular size is normal.Global RV systolic function is has normal systolic function. Left Atrium: Left atrial size was normal in size. Right Atrium: Right atrial size was normal in size Pericardium: There is no evidence of pericardial effusion. Mitral Valve: The mitral valve is normal in structure. No evidence of mitral valve regurgitation. No evidence of mitral valve stenosis by observation. Tricuspid Valve: The tricuspid valve is normal in structure. Tricuspid valve  regurgitation is trivial. Aortic Valve: The aortic valve is normal in structure. Aortic valve regurgitation is not visualized. The aortic valve is structurally normal, with no evidence of sclerosis or stenosis. Pulmonic Valve: The pulmonic valve was normal in structure. Pulmonic valve regurgitation is trivial. Pulmonic regurgitation is trivial. Aorta: The aortic root is normal in size and structure. Venous: The inferior vena cava is normal in size with greater than 50% respiratory variability, suggesting right atrial pressure of 3 mmHg.  Additional Comments: Normal LV systolic function; grade 1 diastolic dysfunction.  LEFT VENTRICLE PLAX 2D LVIDd:         4.90 cm  Diastology LVIDs:         3.00 cm  LV e' lateral:   7.83 cm/s LV PW:  1.10 cm  LV E/e' lateral: 7.1 LV IVS:        0.90 cm  LV e' medial:    8.16 cm/s LVOT diam:     2.20 cm  LV E/e' medial:  6.8 LV SV:         78 ml LV SV Index:   37.47 LVOT Area:     3.80 cm  RIGHT VENTRICLE             IVC RV Basal diam:  2.50 cm     IVC diam: 1.20 cm RV S prime:     12.60 cm/s TAPSE (M-mode): 2.2 cm LEFT ATRIUM             Index       RIGHT ATRIUM           Index LA diam:        3.70 cm 1.83 cm/m  RA Area:     14.10 cm LA Vol (A2C):   57.3 ml 28.42 ml/m RA Volume:   35.30 ml  17.51 ml/m LA Vol (A4C):   45.4 ml 22.51 ml/m LA Biplane Vol: 54.4 ml 26.98 ml/m  AORTIC VALVE LVOT Vmax:   92.00 cm/s LVOT Vmean:  60.800 cm/s LVOT VTI:    0.177 m  AORTA Ao Root diam: 3.50 cm Ao Asc diam:  3.10 cm MITRAL VALVE MV Area (PHT): 3.17 cm             SHUNTS MV PHT:        69.31 msec           Systemic VTI:  0.18 m MV Decel Time: 239 msec             Systemic Diam: 2.20 cm MV E velocity: 55.70 cm/s 103 cm/s MV A velocity: 74.70 cm/s 70.3 cm/s MV E/A ratio:  0.75       1.5  Olga Millers MD Electronically signed by Olga Millers MD Signature Date/Time: 08/22/2019/3:42:23 PM    Final     PHYSICAL EXAM Pleasant mildly obese young Caucasian male not in distress. .  Afebrile. Head is nontraumatic. Neck is supple without bruit.    Cardiac exam no murmur or gallop. Lungs are clear to auscultation. Distal pulses are well felt. Neurological Exam ;  Awake  Alert oriented x 3. Normal speech and language.eye movements full without nystagmus.fundi were not visualized. Vision acuity and fields appear normal. Hearing is normal. Palatal movements are normal. Face symmetric. Tongue midline. Normal strength, tone, reflexes and coordination. Normal sensation. Gait deferred.   ASSESSMENT/PLAN Henry Powell is a 38 y.o. male with history of diet controlled HTN presenting [facility] with sudden onset dizziness on 08/17/2019 while sweeping.    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 Cyst  MRI w/w/o  R inferior cerebellar infarct R PCA distribution. R VA w/ some flow  CT head No hemorrhage. Stable R cerebellar infarct. ASPECTS 10.     CTA head & neck R VA occlusion ? Dissection. No FMD. Mild atherosclerosis B ICA bulbs. Stable R cerebellar infarct   CT Head WO Contrast - 08/23/19 - pending  2D Echo - EF 55 - 60%. No cardiac source of emboli identified.   TCD w/ bubble negative for PFO  LE dopplers negative for DVT  LDL 168  HgbA1c 5.5  SCDs ordered for VTE prophylaxis  No antithrombotic  prior to admission, now on aspirin 325 mg daily and clopidogrel 75 mg daily following load. Continue DAPT x 3 months then aspirin alone  Therapy recommendations:  No f/u therapy recommended  Disposition:  pending   Risk for Cerebral Edema/ Induced Hypernatremia  Placed on 3% protocol  On 3% @ 75h via PIV  Na 135-138-140->142   Goal Na 150-155  Continue 3% overnight. Will not place PICC   Check NA q6h  Plan d/c 3% 12/11    Hypertensive Urgency  Highest BP 184/114  Home meds:  None, diet controlled  BP improving - SBP 106 - 140s  today . Permissive hypertension (OK if < 220/120) but gradually normalize in 5-7 days . Now on Norvasc 5 mg daily and Cleviprex . Long-term BP goal normotensive  Hyperlipidemia  Home meds:  No statin  Now on lipitor 80  LDL 168, goal < 70  Continue statin at discharge  Other Stroke Risk Factors  Smokeless tobacco use (chew)  ETOH use, advised to drink no more than 2 drink(s) a day  Overweight, Body mass index is 29.46 kg/m., recommend weight loss, diet and exercise as appropriate   famiy hx stroke (2 uncles died of stroke one at 26 one < 65)  Other Active Problems  Mildly elevated Cr 1.18. monitor - recheck in AM  Remains at risk for developing cytotoxic edema and obstructive hydrocephalus  Hypercoagulable labs and vasculitic labs - pending  At risk for sleep apnea - consider possible participation in the Sleep Smart Trial.   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  Remains at risk for developing cytotoxic edema and obstructive hydrocephalus.  Recommend start tapering hypertonic saline drip over the next 24 hours and discontinue in the morning.  Follow  hypercoagulable labs and vasculitic labs given his young age.  Aspirin and Plavix for 3 months.  Patient decided not to participate in the sleep smart stroke prevention trial for sleep apnea.  Mobilize out of bed therapy consults.  Long discussion with patient at the bedside and answered questions. This patient is critically ill and at significant risk of neurological worsening, death and care requires constant monitoring of vital signs, hemodynamics,respiratory and cardiac monitoring, extensive review of multiple databases, frequent neurological assessment, discussion with family, other specialists and medical decision making of high complexity.I have made any additions or clarifications directly to the above  note.This critical care time does not reflect procedure time, or teaching time or supervisory time of PA/NP/Med Resident etc but could involve care discussion time.  I spent 30 minutes of neurocritical care time  in the care of  this patient. Delia Heady, MD   To contact Stroke Continuity provider, please refer to [email or web address]. After hours, contact General Neurology

## 2019-08-23 NOTE — Progress Notes (Signed)
Physical Therapy Treatment Patient Details Name: Henry Powell MRN: [id number] DOB: 1981/03/28 Today's Date: 08/23/2019    History of Present Illness 38 yo male admitted to [facility] 12/9 with 3-day history of headaches, HTN, and dizziness. 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 PMH includes HTN, ETOH use.    PT Comments    Pt with improved ambulation distance this session with improved balance with challenges to dynamic standing. Pt with no evidence of unsteadiness or LOB this session. Slight BP elevation, especially DBP 95 pregait, 103 during, 113 immediately post-ambulation. RN notified. PT to continue to follow while acute, but no PT follow up post-acutely currently indicated.    Follow Up Recommendations  No PT follow up;Supervision - Intermittent(No PT follow up currently indicated, PT educated pt on monitoring symptoms of dizziness and if they persist, neuro OPPT recommended)     Equipment Recommendations  None recommended by PT    Recommendations for Other Services       Precautions / Restrictions Precautions Precautions: Fall Precaution Comments: on 3% saline for edema, per pt trying to wean Restrictions Weight Bearing Restrictions: No    Mobility  Bed Mobility Overal bed mobility: Modified Independent                Transfers Overall transfer level: Modified independent     Sit to Stand: Modified independent (Device/Increase time)         General transfer comment: Use of bedrails to come to standing  Ambulation/Gait Ambulation/Gait assistance: Supervision Gait Distance (Feet): 800 Feet Assistive device: None Gait Pattern/deviations: Step-through pattern;WFL(Within Functional Limits) Gait velocity: WFL   General Gait Details: supervision for safety, closer guard for higher level balance challenges but pt with no unsteadiness noted today.   Stairs             Wheelchair Mobility     Modified Rankin (Stroke Patients Only)       Balance Overall balance assessment: Needs assistance Sitting-balance support: No upper extremity supported Sitting balance-Leahy Scale: Normal     Standing balance support: No upper extremity supported;During functional activity Standing balance-Leahy Scale: Good Standing balance comment: accepts moderate challenge during dynamic standing               High Level Balance Comments: tandem walking, backwards walking, fast/slow walking transition, high knee marching, horizontal and vertical head turning during gait all WFL today, no LOB or unsteadiness noted.            Cognition Arousal/Alertness: Awake/alert Behavior During Therapy: WFL for tasks assessed/performed Overall Cognitive Status: Within Functional Limits for tasks assessed                                 General Comments: Pt is pleasant, funny, and conversant with PT/OT      Exercises      General Comments General comments (skin integrity, edema, etc.): BP post-ambulation with DBP 113, RN notified.      Pertinent Vitals/Pain Pain Assessment: No/denies pain Pain Score: 0-No pain    Home Living                      Prior Function            PT Goals (current goals can now be found in the care plan section) Acute Rehab PT Goals Patient Stated Goal: go home, get back to work PT  Goal Formulation: With patient Time For Goal Achievement: 09/05/19 Potential to Achieve Goals: Good Progress towards PT goals: Progressing toward goals    Frequency    Min 3X/week      PT Plan Current plan remains appropriate    Co-evaluation              AM-PAC PT "6 Clicks" Mobility   Outcome Measure  Help needed turning from your back to your side while in a flat bed without using bedrails?: None Help needed moving from lying on your back to sitting on the side of a flat bed without using bedrails?: None Help needed moving to and  from a bed to a chair (including a wheelchair)?: None Help needed standing up from a chair using your arms (e.g., wheelchair or bedside chair)?: None Help needed to walk in hospital room?: None Help needed climbing 3-5 steps with a railing? : A Little 6 Click Score: 23    End of Session   Activity Tolerance: Patient tolerated treatment well Patient left: with call bell/phone within reach;with family/visitor present;in bed(pt agrees to press call button and wait for assist prior to mobilizing to bathroom or back to bed) Nurse Communication: Mobility status PT Visit Diagnosis: Other symptoms and signs involving the nervous system (R29.898);Unsteadiness on feet (R26.81)     Time: 1040-1057 PT Time Calculation (min) (ACUTE ONLY): 17 min  Charges:  $Gait Training: 8-22 mins                     Seraphine Gudiel E, PT Acute Rehabilitation Services Pager [phone]  Office [phone]    Henry Powell 08/23/2019, 11:33 AM

## 2019-08-24 DIAGNOSIS — F1010 Alcohol abuse, uncomplicated: Secondary | ICD-10-CM

## 2019-08-24 DIAGNOSIS — I7774 Dissection of vertebral artery: Secondary | ICD-10-CM

## 2019-08-24 DIAGNOSIS — I10 Essential (primary) hypertension: Secondary | ICD-10-CM

## 2019-08-24 DIAGNOSIS — G936 Cerebral edema: Secondary | ICD-10-CM

## 2019-08-24 DIAGNOSIS — I6389 Other cerebral infarction: Secondary | ICD-10-CM

## 2019-08-24 DIAGNOSIS — E782 Mixed hyperlipidemia: Secondary | ICD-10-CM

## 2019-08-24 LAB — CARDIOLIPIN ANTIBODIES, IGG, IGM, IGA
Anticardiolipin IgA: <9 APL U/mL (ref 0–11)
Anticardiolipin IgG: <9 GPL U/mL (ref 0–14)
Anticardiolipin IgM: <9 MPL U/mL (ref 0–12)

## 2019-08-24 LAB — BASIC METABOLIC PANEL
Anion gap: 10 (ref 5–15)
BUN: 16 mg/dL (ref 6–20)
CO2: 23 mmol/L (ref 22–32)
Calcium: 9.6 mg/dL (ref 8.9–10.3)
Chloride: 106 mmol/L (ref 98–111)
Creatinine, Ser: 1.39 mg/dL — ABNORMAL HIGH (ref 0.61–1.24)
GFR calc Af Amer: >60 mL/min (ref >60)
GFR calc non Af Amer: >60 mL/min (ref >60)
Glucose, Bld: 101 mg/dL — ABNORMAL HIGH (ref 70–99)
Potassium: 4.2 mmol/L (ref 3.5–5.1)
Sodium: 139 mmol/L (ref 135–145)

## 2019-08-24 LAB — RAPID URINE DRUG SCREEN, HOSP PERFORMED
Amphetamines: NOT DETECTED
Barbiturates: NOT DETECTED
Benzodiazepines: NOT DETECTED
Cocaine: NOT DETECTED
Opiates: NOT DETECTED
Tetrahydrocannabinol: NOT DETECTED

## 2019-08-24 LAB — CBC
HCT: 43.5 % (ref 39.0–52.0)
Hemoglobin: 15.1 g/dL (ref 13.0–17.0)
MCH: 30.6 pg (ref 26.0–34.0)
MCHC: 34.7 g/dL (ref 30.0–36.0)
MCV: 88.2 fL (ref 80.0–100.0)
Platelets: 320 10*3/uL (ref 150–400)
RBC: 4.93 MIL/uL (ref 4.22–5.81)
RDW: 12.4 % (ref 11.5–15.5)
WBC: 7 10*3/uL (ref 4.0–10.5)
nRBC: 0 % (ref 0.0–0.2)

## 2019-08-24 LAB — TSH: TSH: 1.882 u[IU]/mL (ref 0.350–4.500)

## 2019-08-24 LAB — VITAMIN B12: Vitamin B-12: 174 pg/mL — ABNORMAL LOW (ref 180–914)

## 2019-08-24 LAB — SODIUM: Sodium: 138 mmol/L (ref 135–145)

## 2019-08-24 MED ORDER — VITAMIN B-12 1000 MCG PO TABS
1000.0000 ug | ORAL_TABLET | Freq: Every day | ORAL | Status: DC
  Administered 2019-08-25 – 2019-08-26 (×2): 1000 ug via ORAL
  Filled 2019-08-24 (×2): qty 1

## 2019-08-24 MED ORDER — LABETALOL HCL 5 MG/ML IV SOLN: 5.0000 mg | INTRAVENOUS | Status: DC | PRN

## 2019-08-24 MED ORDER — CYANOCOBALAMIN 1000 MCG/ML IJ SOLN
1000.0000 ug | Freq: Once | INTRAMUSCULAR | Status: AC
  Administered 2019-08-24: 13:00:00 1000 ug via INTRAMUSCULAR
  Filled 2019-08-24: qty 1

## 2019-08-24 MED ORDER — CHLORHEXIDINE GLUCONATE CLOTH 2 % EX PADS: 6.0000 | MEDICATED_PAD | Freq: Every day | CUTANEOUS | Status: DC

## 2019-08-24 NOTE — Progress Notes (Signed)
Occupational Therapy Treatment Patient Details Name: Henry Powell MRN: [id number] DOB: 13-Aug-1981 Today's Date: 08/24/2019    History of present illness 38 yo male admitted to [facility] 12/9 with 3-day history of headaches, HTN, and dizziness. 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 PMH includes HTN, ETOH use.   OT comments  Pt making great progress towards OT goals this session. Pt reports having just taken a shower with no issues or concerns upon OT arrival. 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 Pt with no LOB or pain noted with BPs WNL. Discussed driving rehabilitation with pt and encouraged pt to discuss with neurologist. . Pt likely getting close to baseline level of function. DC plan remains appropriate, will continue to follow acutely per POC.    Follow Up Recommendations  No OT follow up    Equipment Recommendations  None recommended by OT    Recommendations for Other Services      Precautions / Restrictions Precautions Precautions: Fall Restrictions Weight Bearing Restrictions: No       Mobility Bed Mobility Overal bed mobility: Modified Independent                Transfers Overall transfer level: Modified independent   Transfers: Sit to/from Stand Sit to Stand: Modified independent (Device/Increase time)         General transfer comment: MOD I overall, session focus on higher level balance changes    Balance Overall balance assessment: Needs assistance Sitting-balance support: No upper extremity supported Sitting balance-Leahy Scale: Normal     Standing balance support: No upper extremity supported;During functional activity Standing balance-Leahy Scale: Good Standing balance comment: pt able to complete all dynamic balance challenge with no LOB noted                High Level Balance Comments: pt able to get up/ down from all fours x2 with no LOB or pain noted           ADL either performed or assessed with clinical judgement   ADL Overall ADL's : Modified independent                                       General ADL Comments: pt reports having just gotten out of shower     Vision Baseline Vision/History: No visual deficits Patient Visual Report: No change from baseline     Perception     Praxis      Cognition Arousal/Alertness: Awake/alert Behavior During Therapy: WFL for tasks assessed/performed Overall Cognitive Status: Within Functional Limits for tasks assessed                                 General Comments: pt is very pleasant        Exercises     Shoulder Instructions       General Comments supine pressure: 136/94, after balance challenges: 135/101.    Pertinent Vitals/ Pain       Pain Assessment: No/denies pain  Home Living  Prior Functioning/Environment              Frequency  Min 2X/week        Progress Toward Goals  OT Goals(current goals can now be found in the care plan section)  Progress towards OT goals: Progressing toward goals  Acute Rehab OT Goals Patient Stated Goal: go home, get back to work OT Goal Formulation: With patient Time For Goal Achievement: 09/05/19 Potential to Achieve Goals: Good  Plan Discharge plan remains appropriate    Co-evaluation                 AM-PAC OT "6 Clicks" Daily Activity     Outcome Measure   Help from another person eating meals?: None Help from another person taking care of personal grooming?: None Help from another person toileting, which includes using toliet, bedpan, or urinal?: None Help from another person bathing (including washing, rinsing, drying)?: None Help from another person to put on and taking off regular upper body clothing?:  None Help from another person to put on and taking off regular lower body clothing?: None 6 Click Score: 24    End of Session    OT Visit Diagnosis: Other abnormalities of gait and mobility (R26.89);Other symptoms and signs involving the nervous system (R29.898)   Activity Tolerance Patient tolerated treatment well   Patient Left in bed;with call bell/phone within reach;with family/visitor present   Nurse Communication          Time: 7121-9758 OT Time Calculation (min): 23 min  Charges: OT General Charges $OT Visit: 1 Visit OT Treatments $Therapeutic Activity: 23-37 mins  Audery Amel., COTA/L Acute Rehabilitation Services [phone]    Angelina Pih 08/24/2019, 3:34 PM

## 2019-08-24 NOTE — Plan of Care (Signed)
  Problem: Ischemic Stroke/TIA Tissue Perfusion: Goal: Complications of ischemic stroke/TIA will be minimized Outcome: Progressing   

## 2019-08-24 NOTE — Progress Notes (Signed)
STROKE TEAM PROGRESS NOTE   INTERVAL HISTORY Patient is lying comfortably in bed.  His RN and mom at bedside. Still has mild head tightness but same or better than yesterday. I had long discussion with pt and mom at bedside, updated pt current condition, dissection, stroke risk factor modification and treatment plan and potential prognosis, and answered all the questions. They expressed understanding and appreciation.    Vitals:   08/24/19 0200 08/24/19 0300 08/24/19 0400 08/24/19 0500  BP: 123/88 111/83 (!) 124/94 (!) 118/94  Pulse: (!) 51 (!) 47 64 (!) 50  Resp: 15 11 16 13   Temp:      TempSrc:      SpO2: 97% 98% 95% 100%  Weight:      Height:        CBC:  Recent Labs  Lab 08/23/19 0633 08/24/19 0601  WBC 6.9 7.0  HGB 13.9 15.1  HCT 40.3 43.5  MCV 89.4 88.2  PLT 278 320    Basic Metabolic Panel:  Recent Labs  Lab 08/23/19 0633 08/24/19 0018 08/24/19 0601  NA 142 138 139  K 4.6  --  4.2  CL 109  --  106  CO2 25  --  23  GLUCOSE 106*  --  101*  BUN 14  --  16  CREATININE 1.22  --  1.39*  CALCIUM 9.3  --  9.6   Lipid Panel:     Component Value Date/Time   CHOL 232 (H) 08/22/2019 0541   TRIG 153 (H) 08/22/2019 0541   HDL 33 (L) 08/22/2019 0541   CHOLHDL 7.0 08/22/2019 0541   VLDL 31 08/22/2019 0541   LDLCALC 168 (H) 08/22/2019 0541   HgbA1c:  Lab Results  Component Value Date   HGBA1C 5.5 08/22/2019   Urine Drug Screen: No results found for: LABOPIA, COCAINSCRNUR, LABBENZ, AMPHETMU, THCU, LABBARB  Alcohol Level No results found for: ETH  IMAGING  CT HEAD WO CONTRAST  08/23/2019 IMPRESSION:  Stable extent of right PICA territory infarction. Fourth ventricle remains patent and there is no hydrocephalus.   VAS US TRANSCRANIAL DOPPLER W BUBBLES 08/23/2019 Summary:  Negative TCD Bubble study   ECHOCARDIOGRAM COMPLETE  08/22/2019 IMPRESSIONS   1. Left ventricular ejection fraction, by visual estimation, is 55 to 60%. The left ventricle has normal  function. There is no left ventricular hypertrophy.   2. Left ventricular diastolic parameters are consistent with Grade I diastolic dysfunction (impaired relaxation).   3. The left ventricle has no regional wall motion abnormalities.   4. Global right ventricle has normal systolic function.The right ventricular size is normal.   5. Left atrial size was normal.   6. Right atrial size was normal.   7. The mitral valve is normal in structure. No evidence of mitral valve regurgitation. No evidence of mitral stenosis.   8. The tricuspid valve is normal in structure. Tricuspid valve regurgitation is trivial.   9. The aortic valve is normal in structure. Aortic valve regurgitation is not visualized. No evidence of aortic valve sclerosis or stenosis.  10. The pulmonic valve was normal in structure. Pulmonic valve regurgitation is trivial.  11. The inferior vena cava is normal in size with greater than 50% respiratory variability, suggesting right atrial pressure of 3 mmHg.  12. Normal LV systolic function; grade 1 diastolic dysfunction.    VAS US LOWER EXTREMITY VENOUS (DVT) 08/23/2019 Summary:  Right: There is no evidence of deep vein thrombosis in the lower extremity. No cystic structure found in the  popliteal fossa.  Left: There is no evidence of deep vein thrombosis in the lower extremity. No cystic structure found in the popliteal fossa.     PHYSICAL EXAM Pleasant mildly obese young Caucasian male not in distress. . Afebrile. Head is nontraumatic. Neck is supple without bruit.    Cardiac exam no murmur or gallop. Lungs are clear to auscultation. Distal pulses are well felt. Neurological Exam ;  Awake  Alert oriented x 3. Normal speech and language.eye movements full without nystagmus.fundi were not visualized. Vision acuity and fields appear normal. Hearing is normal. Palatal movements are normal. Face symmetric. Tongue midline. Normal strength, tone, reflexes. Normal sensation. Intact  coordination. Gait deferred.   ASSESSMENT/PLAN [name] is a 38 y.o. male with history of diet controlled HTN presenting to [facility] with sudden onset dizziness on 08/17/2019 while sweeping.   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 However, he does have multiple stroke risk factors  CT head R cerebellar lesion, ? Cyst  MRI w/w/o  R inferior cerebellar infarct R PCA distribution. R VA w/ some flow  CT head No hemorrhage. Stable R cerebellar infarct. ASPECTS 10.     CTA head & neck R VA occlusion ? Dissection. No FMD. Mild atherosclerosis B ICA bulbs. Stable R cerebellar infarct   CT Head WO Contrast - 08/23/19 - Stable extent of right PICA territory infarction. Fourth ventricle remains patent and there is no hydrocephalus.   2D Echo - EF 55 - 60%. No cardiac source of emboli identified.   TCD w/ bubble negative for PFO  LE dopplers negative for DVT  Hypercoagulable labs pending  LDL 168  HgbA1c 5.5  SCDs ordered for VTE prophylaxis  No antithrombotic prior to admission, now on aspirin 325 mg daily and clopidogrel 75 mg daily following load. Continue DAPT x 3 months then aspirin alone  Therapy recommendations: none  Disposition:  pending   Right VA dissection  One week before the presentation, pt had fall hitting head at roller coaster rink  Pt was able to get up and finish the roller skating  Denies LOC  Denies other head trauma or aggressive work out with neck movement  Cerebral Edema/ Induced Hypernatremia  Placed on 3% protocol  On 3% @ 75h via PIV - now off  Na 135-138-140->142->139  Hypertensive Urgency  Highest BP 184/114  Home meds:  None, diet controlled  BP improving . Now on Norvasc 5 mg daily . Long-term BP goal normotensive  Hyperlipidemia  Home meds:  No statin  Now on lipitor 80  LDL 168, goal < 70  Continue statin at discharge  Tobacco abuse  Current tobacco  chewing  Cessation counseling provided  Pt is willing to quit  Alcohol abuse  As per mom, 7-8 drinks per day  Limit alcohol use counseling provided  Pt agree to no more than 2 drinks per day  B12 deficiency  B12 =174  B12 IM once today  B12 po supplement daily  Other Stroke Risk Factors  Overweight, Body mass index is 29.46 kg/m., recommend weight loss, diet and exercise as appropriate   famiy hx stroke (2 uncles died of stroke one at 81 one < 19)  Other Active Problems  Mildly elevated Cr 1.18->1.22->1.39   Hospital day # 3  This patient is critically ill and at significant risk of neurological worsening, death and care requires constant monitoring of vital signs, hemodynamics,respiratory and cardiac monitoring, extensive review  of multiple databases, frequent neurological assessment, discussion with family, other specialists and medical decision making of high complexity. I spent 40 minutes of neurocritical care time  in the care of  this patient. I had long discussion with mom and pt at bedside, updated pt current condition, treatment plan and potential prognosis, and answered all the questions. They expressed understanding and appreciation.   Marvel Plan, MD PhD Stroke Neurology 08/24/2019 11:04 AM   To contact Stroke Continuity provider, please refer to [email or web address]. After hours, contact General Neurology

## 2019-08-24 NOTE — Plan of Care (Signed)
  Problem: Education: Goal: Knowledge of disease or condition will improve Outcome: Progressing   

## 2019-08-25 DIAGNOSIS — E7800 Pure hypercholesterolemia, unspecified: Secondary | ICD-10-CM

## 2019-08-25 DIAGNOSIS — F17200 Nicotine dependence, unspecified, uncomplicated: Secondary | ICD-10-CM

## 2019-08-25 LAB — BASIC METABOLIC PANEL
Anion gap: 12 (ref 5–15)
BUN: 18 mg/dL (ref 6–20)
CO2: 24 mmol/L (ref 22–32)
Calcium: 9.4 mg/dL (ref 8.9–10.3)
Chloride: 102 mmol/L (ref 98–111)
Creatinine, Ser: 1.19 mg/dL (ref 0.61–1.24)
GFR calc Af Amer: >60 mL/min (ref >60)
GFR calc non Af Amer: >60 mL/min (ref >60)
Glucose, Bld: 100 mg/dL — ABNORMAL HIGH (ref 70–99)
Potassium: 4 mmol/L (ref 3.5–5.1)
Sodium: 138 mmol/L (ref 135–145)

## 2019-08-25 LAB — CBC
HCT: 43.4 % (ref 39.0–52.0)
Hemoglobin: 15 g/dL (ref 13.0–17.0)
MCH: 30.5 pg (ref 26.0–34.0)
MCHC: 34.6 g/dL (ref 30.0–36.0)
MCV: 88.4 fL (ref 80.0–100.0)
Platelets: 310 10*3/uL (ref 150–400)
RBC: 4.91 MIL/uL (ref 4.22–5.81)
RDW: 12.3 % (ref 11.5–15.5)
WBC: 7.4 10*3/uL (ref 4.0–10.5)
nRBC: 0 % (ref 0.0–0.2)

## 2019-08-25 LAB — LUPUS ANTICOAGULANT PANEL
DRVVT: 32.6 s (ref 0.0–47.0)
PTT Lupus Anticoagulant: 28.1 s (ref 0.0–51.9)

## 2019-08-25 NOTE — Progress Notes (Addendum)
STROKE TEAM PROGRESS NOTE   INTERVAL HISTORY Patient is lying comfortably in bed.  His RN and mom at bedside. Doing well, no complains. HA much improved. Plan for CT in am and potential d/c after.    Vitals:   08/25/19 0300 08/25/19 0358 08/25/19 0400 08/25/19 0500  BP: (!) 114/99  95/83   Pulse: (!) 56  (!) 51 72  Resp: 12  14 17   Temp:  98.6 F (37 C)    TempSrc:  Oral    SpO2: 95%  95% 95%  Weight:      Height:        CBC:  Recent Labs  Lab 08/24/19 0601 08/25/19 0454  WBC 7.0 7.4  HGB 15.1 15.0  HCT 43.5 43.4  MCV 88.2 88.4  PLT 320 310    Basic Metabolic Panel:  Recent Labs  Lab 08/24/19 0601 08/25/19 0454  NA 139 138  K 4.2 4.0  CL 106 102  CO2 23 24  GLUCOSE 101* 100*  BUN 16 18  CREATININE 1.39* 1.19  CALCIUM 9.6 9.4   Lipid Panel:     Component Value Date/Time   CHOL 232 (H) 08/22/2019 0541   TRIG 153 (H) 08/22/2019 0541   HDL 33 (L) 08/22/2019 0541   CHOLHDL 7.0 08/22/2019 0541   VLDL 31 08/22/2019 0541   LDLCALC 168 (H) 08/22/2019 0541   HgbA1c:  Lab Results  Component Value Date   HGBA1C 5.5 08/22/2019   Urine Drug Screen:     Component Value Date/Time   LABOPIA NONE DETECTED 08/24/2019 1755   COCAINSCRNUR NONE DETECTED 08/24/2019 1755   LABBENZ NONE DETECTED 08/24/2019 1755   AMPHETMU NONE DETECTED 08/24/2019 1755   THCU NONE DETECTED 08/24/2019 1755   LABBARB NONE DETECTED 08/24/2019 1755    Alcohol Level No results found for: ETH  IMAGING  CT HEAD WO CONTRAST  08/23/2019 IMPRESSION:  Stable extent of right PICA territory infarction. Fourth ventricle remains patent and there is no hydrocephalus.   VAS 14/07/2019 TRANSCRANIAL DOPPLER W BUBBLES 08/23/2019 Summary:  Negative TCD Bubble study   ECHOCARDIOGRAM COMPLETE  08/22/2019 IMPRESSIONS   1. Left ventricular ejection fraction, by visual estimation, is 55 to 60%. The left ventricle has normal function. There is no left ventricular hypertrophy.   2. Left ventricular  diastolic parameters are consistent with Grade I diastolic dysfunction (impaired relaxation).   3. The left ventricle has no regional wall motion abnormalities.   4. Global right ventricle has normal systolic function.The right ventricular size is normal.   5. Left atrial size was normal.   6. Right atrial size was normal.   7. The mitral valve is normal in structure. No evidence of mitral valve regurgitation. No evidence of mitral stenosis.   8. The tricuspid valve is normal in structure. Tricuspid valve regurgitation is trivial.   9. The aortic valve is normal in structure. Aortic valve regurgitation is not visualized. No evidence of aortic valve sclerosis or stenosis.  10. The pulmonic valve was normal in structure. Pulmonic valve regurgitation is trivial.  11. The inferior vena cava is normal in size with greater than 50% respiratory variability, suggesting right atrial pressure of 3 mmHg.  12. Normal LV systolic function; grade 1 diastolic dysfunction.    VAS 14/06/2019 LOWER EXTREMITY VENOUS (DVT) 08/23/2019 Summary:  Right: There is no evidence of deep vein thrombosis in the lower extremity. No cystic structure found in the popliteal fossa.  Left: There is no evidence of deep vein thrombosis  in the lower extremity. No cystic structure found in the popliteal fossa.     PHYSICAL EXAM Pleasant mildly obese young Caucasian male not in distress. . Afebrile. Head is nontraumatic. Neck is supple without bruit.    Cardiac exam no murmur or gallop. Lungs are clear to auscultation. Distal pulses are well felt. Neurological Exam ;  Awake  Alert oriented x 3. Normal speech and language.eye movements full without nystagmus.fundi were not visualized. Vision acuity and fields appear normal. Hearing is normal. Palatal movements are normal. Face symmetric. Tongue midline. Normal strength, tone, reflexes. Normal sensation. Intact coordination. Gait deferred.   ASSESSMENT/PLAN [name] is a 38 y.o.  male with history of diet controlled HTN presenting to [facility] with sudden onset dizziness on 08/17/2019 while sweeping.   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 However, he does have multiple stroke risk factors  CT head R cerebellar lesion, ? Cyst  MRI w/w/o  R inferior cerebellar infarct R PCA distribution. R VA w/ some flow  CT head No hemorrhage. Stable R cerebellar infarct. ASPECTS 10.     CTA head & neck R VA occlusion ? Dissection. No FMD. Mild atherosclerosis B ICA bulbs. Stable R cerebellar infarct   CT Head WO Contrast - 08/23/19 - Stable extent of right PICA territory infarction. Fourth ventricle remains patent and there is no hydrocephalus.   Repeat CT head in am  2D Echo - EF 55 - 60%. No cardiac source of emboli identified.   TCD w/ bubble negative for PFO  LE dopplers negative for DVT  Hypercoagulable labs - neg so far but some are pending, see below  LDL 168  HgbA1c 5.5  SCDs ordered for VTE prophylaxis  No antithrombotic prior to admission, now on aspirin 325 mg daily and clopidogrel 75 mg daily following load. Continue DAPT x 3 months then aspirin alone  Therapy recommendations: none  Disposition:  pending - plan for dc in am after CT  Right VA dissection  One week before the presentation, pt had fall hitting head at roller coaster rink  Pt was able to get up and finish the roller skating  Denies LOC  Denies other head trauma or aggressive work out with neck movement  Cerebral Edema/ Induced Hypernatremia  Placed on 3% protocol  On 3% @ 75h via PIV - now off  Na 135-138-140->142->139  Will repeat CT head in am  Hypertensive Urgency  Highest BP 184/114  Home meds:  None, diet controlled  BP improving . Now on Norvasc 5 mg daily . Long-term BP goal normotensive  Hyperlipidemia  Home meds:  No statin  Now on lipitor 80  LDL 168, goal < 70  Continue statin at  discharge  Tobacco abuse  Current tobacco chewing  Cessation counseling provided  Pt is willing to quit  Alcohol abuse  As per mom, 7-8 drinks per day  Limit alcohol use counseling provided  Pt agree to no more than 2 drinks per day  B12 deficiency  B12 =174  B12 IM once today  B12 po supplement daily  Hypercoagulable and Other Labs  RPR - non reactive  Anticardiolipin IgG - neg  Antithrombin III - WNL  HIV - non reactive  Lupus Anticoagulant - pending  Bete -2 - Glycoprotein - pending  Homocysteine - pending   Prothrombin Gene Mutation - pending  Antinuclear Antibodies - pending  Other Stroke Risk Factors  Overweight, Body mass index  is 29.46 kg/m., recommend weight loss, diet and exercise as appropriate   famiy hx stroke (2 uncles died of stroke one at 1544 one < 5765)  Other Active Problems  Mildly elevated Cr 1.18->1.22->1.39->1.19  Hospital day # 4  Marvel PlanJindong Yariel Ferraris, MD PhD Stroke Neurology 08/25/2019 10:18 AM   To contact Stroke Continuity provider, please refer to [email or web address]. After hours, contact General Neurology

## 2019-08-26 ENCOUNTER — Encounter: Payer: Self-pay | Admitting: Neurology

## 2019-08-26 ENCOUNTER — Inpatient Hospital Stay (HOSPITAL_COMMUNITY): Payer: 59

## 2019-08-26 DIAGNOSIS — E538 Deficiency of other specified B group vitamins: Secondary | ICD-10-CM

## 2019-08-26 DIAGNOSIS — G936 Cerebral edema: Secondary | ICD-10-CM | POA: Diagnosis present

## 2019-08-26 DIAGNOSIS — Z823 Family history of stroke: Secondary | ICD-10-CM

## 2019-08-26 DIAGNOSIS — I63211 Cerebral infarction due to unspecified occlusion or stenosis of right vertebral arteries: Secondary | ICD-10-CM

## 2019-08-26 DIAGNOSIS — E785 Hyperlipidemia, unspecified: Secondary | ICD-10-CM | POA: Diagnosis present

## 2019-08-26 DIAGNOSIS — I160 Hypertensive urgency: Secondary | ICD-10-CM

## 2019-08-26 DIAGNOSIS — I7774 Dissection of vertebral artery: Secondary | ICD-10-CM | POA: Diagnosis present

## 2019-08-26 LAB — BASIC METABOLIC PANEL
Anion gap: 10 (ref 5–15)
BUN: 24 mg/dL — ABNORMAL HIGH (ref 6–20)
CO2: 26 mmol/L (ref 22–32)
Calcium: 9.5 mg/dL (ref 8.9–10.3)
Chloride: 102 mmol/L (ref 98–111)
Creatinine, Ser: 1.37 mg/dL — ABNORMAL HIGH (ref 0.61–1.24)
GFR calc Af Amer: >60 mL/min (ref >60)
GFR calc non Af Amer: >60 mL/min (ref >60)
Glucose, Bld: 107 mg/dL — ABNORMAL HIGH (ref 70–99)
Potassium: 5.1 mmol/L (ref 3.5–5.1)
Sodium: 138 mmol/L (ref 135–145)

## 2019-08-26 LAB — CBC
HCT: 44.6 % (ref 39.0–52.0)
Hemoglobin: 15.7 g/dL (ref 13.0–17.0)
MCH: 30.7 pg (ref 26.0–34.0)
MCHC: 35.2 g/dL (ref 30.0–36.0)
MCV: 87.1 fL (ref 80.0–100.0)
Platelets: 321 10*3/uL (ref 150–400)
RBC: 5.12 MIL/uL (ref 4.22–5.81)
RDW: 12.3 % (ref 11.5–15.5)
WBC: 8 10*3/uL (ref 4.0–10.5)
nRBC: 0 % (ref 0.0–0.2)

## 2019-08-26 LAB — BETA-2-GLYCOPROTEIN I ABS, IGG/M/A
Beta-2 Glyco I IgG: <9 GPI IgG units (ref 0–20)
Beta-2-Glycoprotein I IgA: <9 GPI IgA units (ref 0–25)
Beta-2-Glycoprotein I IgM: <9 GPI IgM units (ref 0–32)

## 2019-08-26 LAB — HOMOCYSTEINE: Homocysteine: 16.4 umol/L — ABNORMAL HIGH (ref 0.0–14.5)

## 2019-08-26 MED ORDER — ATORVASTATIN CALCIUM 80 MG PO TABS: 80.0000 mg | ORAL_TABLET | Freq: Every day | ORAL | 2 refills | Status: DC

## 2019-08-26 MED ORDER — ASPIRIN 325 MG PO TABS: 325.0000 mg | ORAL_TABLET | Freq: Every day | ORAL | Status: DC

## 2019-08-26 MED ORDER — CLOPIDOGREL BISULFATE 75 MG PO TABS: 75.0000 mg | ORAL_TABLET | Freq: Every day | ORAL | 2 refills | Status: DC

## 2019-08-26 MED ORDER — CYANOCOBALAMIN 1000 MCG PO TABS: 1000.0000 ug | ORAL_TABLET | Freq: Every day | ORAL | 2 refills | Status: DC

## 2019-08-26 MED ORDER — AMLODIPINE BESYLATE 5 MG PO TABS: 5.0000 mg | ORAL_TABLET | Freq: Every day | ORAL | 2 refills | Status: DC

## 2019-08-26 NOTE — Plan of Care (Signed)
  Problem: Education: Goal: Knowledge of disease or condition will improve Outcome: Progressing   

## 2019-08-26 NOTE — Discharge Summary (Addendum)
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 Hypertension    Past Surgical History:  Procedure Laterality Date  . NO PAST SURGERIES      Allergies as of 08/26/2019   No Known Allergies     Medication List    TAKE these medications   amLODipine 5 MG tablet Commonly known as: NORVASC Take 1 tablet (5 mg total) by mouth daily. Start taking on: August 27, 2019   aspirin 325 MG tablet Take 1 tablet (325 mg total) by mouth daily. Start taking on: August 27, 2019   atorvastatin 80 MG tablet Commonly known as: LIPITOR Take 1 tablet (80 mg total) by mouth daily at 6 PM.   clopidogrel 75 MG tablet Commonly known as: PLAVIX Take 1 tablet (75 mg total) by mouth daily. TAKE X 90 DAYS THEN STOP. CONTINUE ASPIRIN Start taking on: August 27, 2019   cyanocobalamin 1000 MCG tablet Take 1 tablet (1,000 mcg total) by mouth daily. Start taking on: August 27, 2019       LABORATORY STUDIES CBC    Component Value Date/Time   WBC 8.0 08/26/2019 0448   RBC 5.12 08/26/2019 0448   HGB 15.7 08/26/2019 0448   HCT 44.6 08/26/2019 0448   PLT 321 08/26/2019 0448   MCV 87.1 08/26/2019 0448   MCH 30.7 08/26/2019 0448   MCHC 35.2 08/26/2019 0448   RDW 12.3 08/26/2019 0448   CMP    Component Value Date/Time   NA 138 08/26/2019 0448   K 5.1 08/26/2019 0448   CL 102 08/26/2019 0448   CO2 26 08/26/2019 0448   GLUCOSE  107 (H) 08/26/2019 0448   BUN 24 (H) 08/26/2019 0448   CREATININE 1.37 (H) 08/26/2019 0448   CALCIUM 9.5 08/26/2019 0448   PROT 7.6 08/21/2019 1333   ALBUMIN 4.5 08/21/2019 1333   AST 25 08/21/2019 1333   ALT 43 08/21/2019 1333   ALKPHOS 52 08/21/2019 1333   BILITOT 0.8 08/21/2019 1333   GFRNONAA >60 08/26/2019 0448   GFRAA >60 08/26/2019 0448   Lipid Panel    Component Value Date/Time   CHOL 232 (H) 08/22/2019 0541   TRIG 153 (H) 08/22/2019 0541   HDL 33 (L) 08/22/2019 0541   CHOLHDL 7.0 08/22/2019 0541   VLDL 31 08/22/2019 0541   LDLCALC 168 (H) 08/22/2019 0541   HgbA1C  Lab Results  Component Value Date   HGBA1C 5.5 08/22/2019  Urine Drug Screen     Component Value Date/Time   LABOPIA NONE DETECTED 08/24/2019 1755   COCAINSCRNUR NONE DETECTED 08/24/2019 1755   LABBENZ NONE DETECTED 08/24/2019 1755   AMPHETMU NONE DETECTED 08/24/2019 1755   THCU NONE DETECTED 08/24/2019 1755   LABBARB NONE DETECTED 08/24/2019 1755    SIGNIFICANT DIAGNOSTIC STUDIES CT HEAD WO CONTRAST 08/26/2019 Unchanged right PICA infarct without hemorrhage or hydrocephalus.   CT HEAD WO  CONTRAST  08/23/2019 Stable extent of right PICA territory infarction. Fourth ventricle remains patent and there is no hydrocephalus.   CT ANGIO HEAD W OR WO CONTRAST CT ANGIO NECK W OR WO CONTRAST 08/22/2019 1. 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 Given age, an underlying dissection is a strong consideration. No findings of fibromuscular dysplasia in the other vessels. 2. Mild atheromatous plaque at the ICA bulbs without stenosis or ulceration. 3. Stable extent of right cerebellar infarct.  No hydrocephalus.   CT HEAD WO CONTRAST 08/22/2019 1. No acute hemorrhage. 2. Unchanged appearance of right cerebellar infarct. 3. ASPECTS is 10.   MR Brain W and Wo Contrast 08/21/2019 Acute infarction affecting the inferior cerebellum on the right, PICA distribution.  Swelling but no hemorrhagic transformation. Non dominant right vertebral artery appears to show at least some flow.  CT Head Wo Contrast 08/21/2019 Lesion in the right cerebellum could be an infarct or a benign entity such as an arachnoid cyst or epidermoid cyst but cannot be definitively characterized.   DG Chest 2 View 08/21/2019 No active cardiopulmonary disease.     VAS [place] TRANSCRANIAL DOPPLER W BUBBLES 08/23/2019 Negative TCD Bubble study   ECHOCARDIOGRAM COMPLETE  08/22/2019 1. Left ventricular ejection fraction, by visual estimation, is 55 to 60%. The left ventricle has normal function. There is no left ventricular hypertrophy.   2. Left ventricular diastolic parameters are consistent with Grade I diastolic dysfunction (impaired relaxation).   3. The left ventricle has no regional wall motion abnormalities.   4. Global right ventricle has normal systolic function.The right ventricular size is normal.   5. Left atrial size was normal.   6. Right atrial size was normal.   7. The mitral valve is normal in structure. No evidence of mitral valve regurgitation. No evidence of mitral stenosis.   8. The tricuspid valve is normal in structure. Tricuspid valve regurgitation is trivial.   9. The aortic valve is normal in structure. Aortic valve regurgitation is not visualized. No evidence of aortic valve sclerosis or stenosis.  10. The pulmonic valve was normal in structure. Pulmonic valve regurgitation is trivial.  11. The inferior vena cava is normal in size with greater than 50% respiratory variability, suggesting right atrial pressure of 3 mmHg.  12. Normal LV systolic function; grade 1 diastolic dysfunction.   VAS [place] LOWER EXTREMITY VENOUS (DVT) 08/23/2019 Right: There is no evidence of deep vein thrombosis in the lower extremity. No cystic structure found in the popliteal fossa.  Left: There is no evidence of deep vein thrombosis in the lower extremity. No cystic structure found in  the popliteal fossa.       HISTORY OF PRESENT ILLNESS Henry Powell is a 38 y.o. male with a history of hypertension who has been controlling it with diet for the past several years who presents with dizziness that started 12/6.  He states that he was sweeping when he felt sudden onset of dizziness.  Since that time, he has noticed that he has been running into things and been slightly off balance.  He denies any nausea or vomiting or vertigo.  Due to continuing to not feel right, he sought care at [facility] where an MRI revealed a fairly large cerebellar stroke. He was transferred to [facility] for monitoring in the neuro ICU given concern for edema. Started on 3% saline protocol. He was not a tpa candidate as he was outside of  window.   HOSPITAL COURSE Henry Powell is a 38 y.o. male with history of diet controlled HTN presenting to [facility] with sudden onset dizziness on 08/17/2019 while sweeping.   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 However, he does have multiple stroke risk factors  CT head R cerebellar lesion, ? Cyst  MRI w/w/o  R inferior cerebellar infarct R PCA distribution. R VA w/ some flow  CT head No hemorrhage. Stable R cerebellar infarct. ASPECTS 10.     CTA head & neck R VA occlusion ? Dissection. No FMD. Mild atherosclerosis B ICA bulbs. Stable R cerebellar infarct   CT Head WO Contrast - 08/23/19 - Stable extent of right PICA territory infarction. Fourth ventricle remains patent and there is no hydrocephalus.   Repeat CT head 12/14 stable  2D Echo - EF 55 - 60%. No cardiac source of emboli identified.   TCD w/ bubble negative for PFO  LE dopplers negative for DVT  Hypercoagulable labs - neg so far but some are pending, see below  LDL 168  HgbA1c 5.5  No antithrombotic prior to admission, now on aspirin 325 mg daily and clopidogrel 75 mg daily following  load. Continue DAPT x 3 months then aspirin alone  Therapy recommendations: none  Disposition:  return home  Right VA dissection  One week before the presentation, pt had fall hitting head at roller skaing rink  Pt was able to get up and finish the roller skating  Denies LOC  Denies other head trauma or aggressive work out with neck movement  Cerebral Edema/ Induced Hypernatremia  Placed on 3% protocol  On 3% @ 75h via PIV - now off   Na 135-138-140->142->139  Repeat CT head 12/14 stable  Hypertensive Urgency  Highest BP 184/114  Home meds:  None, diet controlled  BP improving  Treated with cleviprex  Now on Norvasc 5 mg daily  Long-term BP goal normotensive  Hyperlipidemia  Home meds:  No statin  Now on lipitor 80  LDL 168, goal < 70  Continue statin at discharge  Tobacco abuse  Current tobacco chewing  Cessation counseling provided  Pt is willing to quit  Alcohol abuse  As per mom, 7-8 drinks per day  Limit alcohol use counseling provided  Pt agree to no more than 2 drinks per day  B12 deficiency  B12 =174  B12 IM once today  B12 po supplement daily  Hypercoagulable and Other Labs  RPR - non reactive  Anticardiolipin IgG - neg  Antithrombin III - WNL  HIV - non reactive  Lupus Anticoagulant - not detected  Bete -2 - Glycoprotein - pending  Homocysteine - pending   Prothrombin Gene Mutation - pending  Antinuclear Antibodies - pending  Other Stroke Risk Factors  Overweight, Body mass index is 29.46 kg/m., recommend weight loss, diet and exercise as appropriate   famiy hx stroke (2 uncles died of stroke one at 3 one < 65)  Other Active Problems  Mildly elevated Cr 1.18->1.22->1.39->1.19->1.37   DISCHARGE EXAM Blood pressure (!) 133/109, pulse 72, temperature 98 F (36.7 C), temperature source Oral, resp. rate 18, height 5\' 8"  (1.727 m), weight 87.9 kg, SpO2 96 %.  Pleasant mildly obese young  Caucasian male not in distress. . Afebrile. Head is nontraumatic. Neck is supple without bruit. Cardiac exam no murmur or gallop. Lungs are clear to auscultation. Distal pulses are well felt. Neurological Exam  Awake  Alert  oriented x 3. Normal speech and language.eye movements full without nystagmus.fundi were not visualized. Vision acuity and fields appear normal. Hearing is normal. Palatal movements are normal. Face symmetric. Tongue midline. Normal strength, tone, reflexes. Normal sensation. Intact coordination. Gait deferred.  Discharge Diet   Regular thin liquids  DISCHARGE PLAN  Disposition:  Return home  aspirin 325 mg daily and clopidogrel 75 mg daily for secondary stroke prevention for 3 months then ASPIRIN alone.  Ongoing stroke risk factor control by Primary Care Physician at time of discharge  [facility]  Follow-up in [facility] in 4 weeks, office to schedule an appointment.   35 minutes were spent preparing discharge.  Marvel PlanJindong Ninfa Giannelli, MD PhD Stroke Neurology 08/26/2019 2:19 PM

## 2019-08-27 LAB — ANTINUCLEAR ANTIBODIES, IFA: ANA Ab, IFA: NEGATIVE

## 2019-08-29 LAB — PROTHROMBIN GENE MUTATION

## 2019-09-10 ENCOUNTER — Other Ambulatory Visit: Payer: Self-pay

## 2019-09-10 ENCOUNTER — Ambulatory Visit: Payer: Self-pay | Admitting: Physician Assistant

## 2019-09-10 VITALS — BP 136/92 | HR 72 | Temp 98.0°F | Ht 68.0 in | Wt 199.8 lb

## 2019-09-10 DIAGNOSIS — I63211 Cerebral infarction due to unspecified occlusion or stenosis of right vertebral arteries: Secondary | ICD-10-CM

## 2019-09-10 NOTE — Progress Notes (Signed)
   Subjective: Follow-up post CVA    Patient ID: Henry Powell, male    DOB: Jan 23, 1981, 38 y.o.   MRN: [id number]  HPI Patient presents for follow-up post CVA on 08/21/2019.  Patient was seen at [facility] for dizziness and headache.  CT scan/MRI with consistent with a right PICA territory infarct.  Patient was transferred to [facility] neurology department for admission.  Patient discharge August 26, 2019 and advised to follow-up today.  Patient has been placed on 1/2-day work schedule pending reevaluation by neurology on 10/02/2019.  Patient has started Lipitor, Plavix, aspirin, and B12.  Patient states still having transit mild vertigo.   Review of Systems Hypertension and hyperlipidemia.    Objective:   Physical Exam No acute distress.  See stable vital signs.  HEENT unremarkable.  Neck is supple without adenopathy or bruits.  Lungs are clear to auscultation heart is regular rate and rhythm.       Assessment & Plan: Status post CVA.  Patient advised to continue medication listed above.  Patient will continue limited work pending reevaluation by neurology on 10/03/2019.  Patient advised return back to the clinic or emergency room if symptoms worsens.  Discussed discontinue alcohol and tobacco products.  We will readdress this issue when he return back for his reevaluation to full duty.

## 2019-09-24 ENCOUNTER — Telehealth: Payer: Self-pay

## 2019-09-24 NOTE — Telephone Encounter (Signed)
Called pt to see if he would be interested in seeing the NP Ihor Austin sooner on the 13 or 14th. Pt declined states that he would rather be seen in person.

## 2019-10-01 NOTE — Progress Notes (Signed)
[facility] [address] [place] [id number] [phone]       HOSPITAL FOLLOW UP NOTE  Mr. Rochele RaringJustin Frisk Date of Birth:  02/22/1981 Medical Record Number:  [id number]   Reason for Referral:  hospital stroke follow up    CHIEF COMPLAINT:  Chief Complaint  Patient presents with  . Follow-up    TxRm. with mother. Still having headaches. Stipulates that he sometimes gets tired.    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  History obtained from patient, mother and chart review. Reviewed all radiology images and labs personally.  Mr.Argil Daltonis a 38 y.o.malewith history of diet controlled [facility] 08/17/2019 while sweeping.  MRI revealed fairly large cerebellar stroke and [facility] given concern for edema.  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  2D echo showed an EF of 55 to 60% without cardiac source of embolus identified.  TCD w/ bubble negative for PFO. LE Dopplers negative for DVT.  Hypercoagulable labs with some resulted negative but some pending at discharge.  Recommended DAPT for 3 months then aspirin alone.  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  Cerebral edema with induced hyponatremia treated with 3% with stabilization on repeat imaging.  Hypertensive urgency upon arrival with BP 184/114 treated with Cleviprex and initiated amlodipine 5 mg daily with long-term BP normotensive range.  LDL 168 and initiated atorvastatin 80 mg daily.  Current  tobacco use with smoking cessation counseling provided.  Alcohol use reporting 7-8 drinks per day with alcohol abuse counseling provided.  B12 deficiency noted and initiated B12 supplement.  Other stroke risk factors include overweight and family history of stroke but no personal history of stroke.  He was discharged home in stable condition.  Mr. Freida BusmanDalton is a 39 year old male who is being seen today for hospital follow-up accompanied by his mother.  Residual deficits of occasional dizziness, mild fatigue and generalized headaches but endorses ongoing improvement.  Reports headaches are generalized and dull type sensation with pain level 3/10 and will take Tylenol with benefit.  Headaches are not daily and are not associated with any other symptoms.  Denies neck pain.  Dizziness typically occurs while bending over or quickly changing positions.  Sensation lasts only a couple seconds and then subsides.  Denies any difficulty with balance or gait.  He also endorses anxiety post stroke without prior anxiety or depression.  He has returned to work on a part-time basis until today's follow-up for possible return for full-time.  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  He does not endorse difficulty with performing job duties with part-time work.  He does live alone and has split custody of his 377 and 37-year-old children.  Able to maintain ADLs and IADLs independently without difficulty.  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  He has not returned back to country club or [employer] at this time.  He has continued on aspirin and Plavix without bleeding or  bruising.  Continues on atorvastatin without myalgias.  Blood pressure today 136/95.  He endorses complete cessation of smokeless tobacco since hospital discharge as well as alcohol use.  He is also continued on cyanocobalamin 1000 mcg daily for  B12 deficiency.  Further review of lab work obtained during admission as some results are still pending in regards to hypercoagulable work-up.  Homocysteine level was slightly elevated at 16.4.  All other within normal limits.  Denies new or worsening stroke/TIA symptoms.    ROS:   14 system review of systems performed and negative with exception of headache, fatigue  PMH:  Past Medical History:  Diagnosis Date  . Hypertension   . Stroke [facility])     PSH:  Past Surgical History:  Procedure Laterality Date  . NO PAST SURGERIES      Social History:  Social History   Socioeconomic History  . Marital status: Divorced    Spouse name: Not on file  . Number of children: Not on file  . Years of education: Not on file  . Highest education level: Not on file  Occupational History  . Not on file  Tobacco Use  . Smoking status: Never Smoker  . Smokeless tobacco: Current User    Types: Chew  Substance and Sexual Activity  . Alcohol use: Yes    Alcohol/week: 56.0 standard drinks    Types: 56 Cans of beer per week  . Drug use: Never  . Sexual activity: Not on file  Other Topics Concern  . Not on file  Social History Narrative  . Not on file   Social Determinants of Health   Financial Resource Strain:   . Difficulty of Paying Living Expenses: Not on file  Food Insecurity:   . Worried About Programme researcher, broadcasting/film/video in the Last Year: Not on file  . Ran Out of Food in the Last Year: Not on file  Transportation Needs:   . Lack of Transportation (Medical): Not on file  . Lack of Transportation (Non-Medical): Not on file  Physical Activity:   . Days of Exercise per Week: Not on file  . Minutes of Exercise per Session: Not on file  Stress:   . Feeling of Stress : Not on file  Social Connections:   . Frequency of Communication with Friends and Family: Not on file  . Frequency of Social Gatherings with Friends and Family: Not on file  . Attends Religious Services: Not on file  .  Active Member of Clubs or Organizations: Not on file  . Attends Banker Meetings: Not on file  . Marital Status: Not on file  Intimate Partner Violence:   . Fear of Current or Ex-Partner: Not on file  . Emotionally Abused: Not on file  . Physically Abused: Not on file  . Sexually Abused: Not on file    Family History:  Family History  Problem Relation Age of Onset  . Hypertension Mother   . Diabetes Mother   . Hypertension Father   . Stroke Paternal Uncle   . Stroke Paternal Uncle     Medications:   Current Outpatient Medications on File Prior to Visit  Medication Sig Dispense Refill  . amLODipine (NORVASC) 5 MG tablet Take 1 tablet (5 mg total) by mouth daily. 30 tablet 2  . atorvastatin (LIPITOR) 80 MG tablet Take 1 tablet (80 mg total) by mouth daily at 6 PM. 30 tablet 2  . clopidogrel (PLAVIX) 75 MG tablet Take 1 tablet (75 mg  total) by mouth daily. TAKE X 90 DAYS THEN STOP. CONTINUE ASPIRIN 30 tablet 2  . vitamin B-12 1000 MCG tablet Take 1 tablet (1,000 mcg total) by mouth daily. 30 tablet 2   No current facility-administered medications on file prior to visit.    Allergies:  No Known Allergies   Physical Exam  Vitals:   10/02/19 0805  BP: (!) 136/95  Pulse: 90  Temp: (!) 97.5 F (36.4 C)  Weight: 200 lb 3.2 oz (90.8 kg)  Height: 5\' 8"  (1.727 m)   Body mass index is 30.44 kg/m. No exam data present  Depression screen [facility] 2/9 10/02/2019  Decreased Interest 0  Down, Depressed, Hopeless 0  PHQ - 2 Score 0    GAD 7 : Generalized Anxiety Score 10/02/2019  Nervous, Anxious, on Edge 1  Control/stop worrying 1  Worry too much - different things 1  Trouble relaxing 1  Restless 0  Easily annoyed or irritable 2  Afraid - awful might happen 2  Total GAD 7 Score 8  Anxiety Difficulty Somewhat difficult    General: well developed, well nourished,  pleasant middle-aged Caucasian male, seated, in no evident distress Head: head normocephalic and  atraumatic.   Neck: supple with no carotid or supraclavicular bruits Cardiovascular: regular rate and rhythm, no murmurs Musculoskeletal: no deformity Skin:  no rash/petichiae Vascular:  Normal pulses all extremities   Neurologic Exam Mental Status: Awake and fully alert.   Normal speech and language.  Oriented to place and time. Recent and remote memory intact. Attention span, concentration and fund of knowledge appropriate. Mood and affect appropriate.  Cranial Nerves: Fundoscopic exam reveals sharp disc margins. Pupils equal, briskly reactive to light. Extraocular movements full without nystagmus. Visual fields full to confrontation. Hearing intact. Facial sensation intact. Face, tongue, palate moves normally and symmetrically.  Motor: Normal bulk and tone. Normal strength in all tested extremity muscles. Sensory.: intact to touch , pinprick , position and vibratory sensation.  Coordination: Rapid alternating movements normal in all extremities. Finger-to-nose and heel-to-shin performed accurately bilaterally. Gait and Station: Arises from chair without difficulty. Stance is normal. Gait demonstrates normal stride length and balance.  Able to tandem gait, toe raise on toes and go back on heels without difficulty.  Romberg negative. Reflexes: 1+ and symmetric. Toes downgoing.     NIHSS  0 Modified Rankin  1    Diagnostic Data (Labs, Imaging, Testing)  CT HEAD WO CONTRAST 08/26/2019 Unchanged right PICA infarct without hemorrhage or hydrocephalus.   CT HEAD WO CONTRAST  08/23/2019 Stable extent of right PICA territory infarction. Fourth ventricle remains patent and there is no hydrocephalus.   CT ANGIO HEAD W OR WO CONTRAST CT ANGIO NECK W OR WO CONTRAST 08/22/2019 1. 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 Given age, an underlying dissection is a strong consideration. No findings of fibromuscular dysplasia in the other vessels. 2.  Mild atheromatous plaque at the ICA bulbs without stenosis or ulceration. 3. Stable extent of right cerebellar infarct.  No hydrocephalus.   CT HEAD WO CONTRAST 08/22/2019 1. No acute hemorrhage. 2. Unchanged appearance of right cerebellar infarct. 3. ASPECTS is 10.   MR Brain W and Wo Contrast 08/21/2019 Acute infarction affecting the inferior cerebellum on the right, PICA distribution. Swelling but no hemorrhagic transformation. Non dominant right vertebral artery appears to show at least some flow.  CT Head Wo Contrast 08/21/2019 Lesion in the right cerebellum could be an infarct or a  benign entity such as an arachnoid cyst or epidermoid cyst but cannot be definitively characterized.   DG Chest 2 View 08/21/2019 No active cardiopulmonary disease.     VAS [place] TRANSCRANIAL DOPPLER W BUBBLES 08/23/2019 Negative TCD Bubble study   ECHOCARDIOGRAM COMPLETE  08/22/2019 1. Left ventricular ejection fraction, by visual estimation, is 55 to 60%.The left ventricle has normal function. There is no left ventricular hypertrophy.  2. Left ventricular diastolic parameters are consistent with Grade I diastolic dysfunction (impaired relaxation).  3. The left ventricle has no regional wall motion abnormalities.  4. Global right ventricle has normal systolic function.The right ventricular size is normal.  5. Left atrial size was normal.  6. Right atrial size was normal.  7. The mitral valve is normal in structure. No evidence of mitral valve regurgitation. No evidence of mitral stenosis.  8. The tricuspid valve is normal in structure. Tricuspid valve regurgitation is trivial.  9. The aortic valve is normal in structure. Aortic valve regurgitation is not visualized. No evidence of aortic valve sclerosis or stenosis.  10. The pulmonic valve was normal in structure. Pulmonic valve regurgitation is trivial.  11. The inferior vena cava is normal in size with greater than 50% respiratory  variability, suggesting right atrial pressure of 3 mmHg.  12. Normal LV systolic function; grade 1 diastolic dysfunction.   VAS [place] LOWER EXTREMITY VENOUS (DVT) 08/23/2019 Right:There is no evidence of deep vein thrombosis in the lower extremity. No cystic structure found in the popliteal fossa.  Left: There is no evidence of deep vein thrombosis in the lower extremity. No cystic structure found in the popliteal fossa.     ASSESSMENT: Coy Rochford is a 39 y.o. 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 Vascular risk factors include HTN, HLD, tobacco use, alcohol abuse, vitamin B-12 deficiency and family history of stroke.  Residual deficits of intermittent dizziness, occasional generalized headaches and fatigue but overall improving.    PLAN:  1. Right cerebellar stroke: Continue aspirin 81 mg daily and clopidogrel 75 mg daily  and atorvastatin 80 mg daily for secondary stroke prevention.  Advised to continue DAPT for total duration of 3 months and then discontinue Plavix and continue on aspirin alone.  Maintain strict control of hypertension with blood pressure goal below 130/90, diabetes with hemoglobin A1c goal below 6.5% and cholesterol with LDL cholesterol (bad cholesterol) goal below 70 mg/dL.  I also advised the patient to eat a healthy diet with plenty of whole grains, cereals, fruits and vegetables, exercise regularly with at least 30 minutes of continuous activity daily and maintain ideal body weight. 2. Right VA dissection: Complete 110-month DAPT and then aspirin alone.  We will repeat CTA neck in 4 weeks for further evaluation of dissection.  Discussion regarding avoiding rapid head movements and performing activities that can cause further trauma or manipulation during this time. 3. HTN: Advised to continue current treatment regimen.  Today's BP stable.  Advised  to continue to monitor at home along with continued follow-up with PCP for management 4. HLD: Advised to continue current treatment regimen along with continued follow-up with PCP for future prescribing and monitoring of lipid panel.  We will repeat lab work at today's visit 5. Smokeless tobacco and alcohol use: Congratulated on complete cessation.  He does question occasional alcohol drinks advised no more than 1-2 daily.  He verbalized understanding. 6. B12 deficiency: Continuation of supplementation  1000 mcg daily.  We will repeat lab work 7. Elevated homocystine: We will repeat lab work in addition to obtaining repeat B12, B6 and folate 8. Reactive anxiety, post stroke: GAD-7 score 8.  Initiated Lexapro 10 mg nightly and reviewed possible side effects and provided patient additional education.  He declined interest with therapy at this time 9. He was cleared to return to work full-time but on [employer] duty.  Was provided a work note and will reassess this at follow-up in 6 weeks.     Follow up in 6 weeks or call earlier if needed   Greater than 50% of time during this 45 minute visit was spent on counseling, explanation of diagnosis of right cerebellar stroke, reviewing risk factor management of right VA dissection, HTN, HLD, prior tobacco and EtOH use, B12 deficiency and elevated homocysteine, discussion regarding post stroke anxiety and treatment options, planning of further management along with potential future management, and discussion with patient and family answering all questions.    Ihor Austin, AGNP-BC  [facility] [address] [place], [place] [address]  Phone [phone] Fax [phone] Note: This document was prepared with digital dictation and possible smart phrase technology. Any transcriptional errors that result from this process are unintentional.

## 2019-10-02 ENCOUNTER — Encounter: Payer: Self-pay | Admitting: *Deleted

## 2019-10-02 ENCOUNTER — Other Ambulatory Visit: Payer: Self-pay

## 2019-10-02 ENCOUNTER — Ambulatory Visit (INDEPENDENT_AMBULATORY_CARE_PROVIDER_SITE_OTHER): Payer: Managed Care, Other (non HMO) | Admitting: Adult Health

## 2019-10-02 ENCOUNTER — Encounter: Payer: Self-pay | Admitting: Adult Health

## 2019-10-02 VITALS — BP 136/95 | HR 90 | Temp 97.5°F | Ht 68.0 in | Wt 200.2 lb

## 2019-10-02 DIAGNOSIS — I63211 Cerebral infarction due to unspecified occlusion or stenosis of right vertebral arteries: Secondary | ICD-10-CM | POA: Diagnosis not present

## 2019-10-02 DIAGNOSIS — I10 Essential (primary) hypertension: Secondary | ICD-10-CM | POA: Diagnosis not present

## 2019-10-02 DIAGNOSIS — E538 Deficiency of other specified B group vitamins: Secondary | ICD-10-CM | POA: Diagnosis not present

## 2019-10-02 DIAGNOSIS — R7989 Other specified abnormal findings of blood chemistry: Secondary | ICD-10-CM

## 2019-10-02 DIAGNOSIS — E785 Hyperlipidemia, unspecified: Secondary | ICD-10-CM

## 2019-10-02 DIAGNOSIS — I7774 Dissection of vertebral artery: Secondary | ICD-10-CM

## 2019-10-02 MED ORDER — ESCITALOPRAM OXALATE 10 MG PO TABS: 10.0000 mg | ORAL_TABLET | Freq: Every day | ORAL | 3 refills | Status: DC

## 2019-10-02 NOTE — Progress Notes (Signed)
I agree with the above plan 

## 2019-10-02 NOTE — Patient Instructions (Signed)
Continue aspirin 81 mg daily and clopidogrel 75 mg daily  and Lipitor for secondary stroke prevention  Continue both aspirin and Plavix for a total of 69-month duration and then discontinue Plavix and continue aspirin at home  Continue to follow up with PCP regarding cholesterol and blood pressure management   Lab work checked today including: Cholesterol levels, vitamin B12 levels, vitamin B6 levels, homocysteine and folate  You will be called to schedule repeat imaging of your head to assess for improvement of dissection -recommend obtaining this in approximately 4 weeks  Initiate Lexapro 10 mg nightly for post stroke depression/anxiety -this medication may take 4 to 6 weeks to take full effect so please ensure you get this enough time.  We can discuss increasing dose if needed at follow-up visit  Cleared to return to work full-time light duty and we will reassess this at follow-up in 6 weeks  Continue to monitor blood pressure at home  Maintain strict control of hypertension with blood pressure goal below 130/90, diabetes with hemoglobin A1c goal below 6.5% and cholesterol with LDL cholesterol (bad cholesterol) goal below 70 mg/dL. I also advised the patient to eat a healthy diet with plenty of whole grains, cereals, fruits and vegetables, exercise regularly and maintain ideal body weight.  Followup in the future with me in 6 weeks or call earlier if needed       Thank you for coming to see [place] at [facility]. I hope we have been able to provide you high quality care today.  You may receive a patient satisfaction survey over the next few weeks. We would appreciate your feedback and comments so that we may continue to improve ourselves and the health of our patients.   Escitalopram tablets What is this medicine? ESCITALOPRAM (es sye TAL oh pram) is used to treat depression and certain types of anxiety. This medicine may be used for other purposes; ask your health  care provider or pharmacist if you have questions. COMMON BRAND NAME(S): Lexapro What should I tell my health care provider before I take this medicine? They need to know if you have any of these conditions:  bipolar disorder or a family history of bipolar disorder  diabetes  glaucoma  heart disease  kidney or liver disease  receiving electroconvulsive therapy  seizures (convulsions)  suicidal thoughts, plans, or attempt by you or a family member  an unusual or allergic reaction to escitalopram, the related drug citalopram, other medicines, foods, dyes, or preservatives  pregnant or trying to become pregnant  breast-feeding How should I use this medicine? Take this medicine by mouth with a glass of water. Follow the directions on the prescription label. You can take it with or without food. If it upsets your stomach, take it with food. Take your medicine at regular intervals. Do not take it more often than directed. Do not stop taking this medicine suddenly except upon the advice of your doctor. Stopping this medicine too quickly may cause serious side effects or your condition may worsen. A special MedGuide will be given to you by the pharmacist with each prescription and refill. Be sure to read this information carefully each time. Talk to your pediatrician regarding the use of this medicine in children. Special care may be needed. Overdosage: If you think you have taken too much of this medicine contact a poison control center or emergency room at once. NOTE: This medicine is only for you. Do not share this medicine with others. What if  I miss a dose? If you miss a dose, take it as soon as you can. If it is almost time for your next dose, take only that dose. Do not take double or extra doses. What may interact with this medicine? Do not take this medicine with any of the following medications:  certain medicines for fungal infections like fluconazole, itraconazole,  ketoconazole, posaconazole, voriconazole  cisapride  citalopram  dronedarone  linezolid  MAOIs like Carbex, Eldepryl, Marplan, Nardil, and Parnate  methylene blue (injected into a vein)  pimozide  thioridazine This medicine may also interact with the following medications:  alcohol  amphetamines  aspirin and aspirin-like medicines  carbamazepine  certain medicines for depression, anxiety, or psychotic disturbances  certain medicines for migraine headache like almotriptan, eletriptan, frovatriptan, naratriptan, rizatriptan, sumatriptan, zolmitriptan  certain medicines for sleep  certain medicines that treat or prevent blood clots like warfarin, enoxaparin, dalteparin  cimetidine  diuretics  dofetilide  fentanyl  furazolidone  isoniazid  lithium  metoprolol  NSAIDs, medicines for pain and inflammation, like ibuprofen or naproxen  other medicines that prolong the QT interval (cause an abnormal heart rhythm)  procarbazine  rasagiline  supplements like St. John's wort, kava kava, valerian  tramadol  tryptophan  ziprasidone This list may not describe all possible interactions. Give your health care provider a list of all the medicines, herbs, non-prescription drugs, or dietary supplements you use. Also tell them if you smoke, drink alcohol, or use illegal drugs. Some items may interact with your medicine. What should I watch for while using this medicine? Tell your doctor if your symptoms do not get better or if they get worse. Visit your doctor or health care professional for regular checks on your progress. Because it may take several weeks to see the full effects of this medicine, it is important to continue your treatment as prescribed by your doctor. Patients and their families should watch out for new or worsening thoughts of suicide or depression. Also watch out for sudden changes in feelings such as feeling anxious, agitated, panicky, irritable,  hostile, aggressive, impulsive, severely restless, overly excited and hyperactive, or not being able to sleep. If this happens, especially at the beginning of treatment or after a change in dose, call your health care professional. Dennis Bast may get drowsy or dizzy. Do not drive, use machinery, or do anything that needs mental alertness until you know how this medicine affects you. Do not stand or sit up quickly, especially if you are an older patient. This reduces the risk of dizzy or fainting spells. Alcohol may interfere with the effect of this medicine. Avoid alcoholic drinks. Your mouth may get dry. Chewing sugarless gum or sucking hard candy, and drinking plenty of water may help. Contact your doctor if the problem does not go away or is severe. What side effects may I notice from receiving this medicine? Side effects that you should report to your doctor or health care professional as soon as possible:  allergic reactions like skin rash, itching or hives, swelling of the face, lips, or tongue  anxious  black, tarry stools  changes in vision  confusion  elevated mood, decreased need for sleep, racing thoughts, impulsive behavior  eye pain  fast, irregular heartbeat  feeling faint or lightheaded, falls  feeling agitated, angry, or irritable  hallucination, loss of contact with reality  loss of balance or coordination  loss of memory  painful or prolonged erections  restlessness, pacing, inability to keep still  seizures  stiff muscles  suicidal thoughts or other mood changes  trouble sleeping  unusual bleeding or bruising  unusually weak or tired  vomiting Side effects that usually do not require medical attention (report to your doctor or health care professional if they continue or are bothersome):  changes in appetite  change in sex drive or performance  headache  increased sweating  indigestion, nausea  tremors This list may not describe all possible  side effects. Call your doctor for medical advice about side effects. You may report side effects to FDA at 1-800-FDA-1088. Where should I keep my medicine? Keep out of reach of children. Store at room temperature between 15 and 30 degrees C (59 and 86 degrees F). Throw away any unused medicine after the expiration date. NOTE: This sheet is a summary. It may not cover all possible information. If you have questions about this medicine, talk to your doctor, pharmacist, or health care provider.  2020 Elsevier/Gold Standard (2018-08-20 11:21:44)

## 2019-10-03 LAB — LIPID PANEL
Chol/HDL Ratio: 3.9 ratio (ref 0.0–5.0)
Cholesterol, Total: 136 mg/dL (ref 100–199)
HDL: 35 mg/dL — ABNORMAL LOW (ref >39)
LDL Chol Calc (NIH): 79 mg/dL (ref 0–99)
Triglycerides: 119 mg/dL (ref 0–149)
VLDL Cholesterol Cal: 22 mg/dL (ref 5–40)

## 2019-10-05 LAB — FOLATE: Folate: 11.5 ng/mL (ref >3.0)

## 2019-10-05 LAB — HOMOCYSTEINE: Homocysteine: 12.4 umol/L (ref 0.0–14.5)

## 2019-10-05 LAB — VITAMIN B12: Vitamin B-12: 871 pg/mL (ref 232–1245)

## 2019-10-05 LAB — VITAMIN B6: Vitamin B6: 16.1 ug/L (ref 5.3–46.7)

## 2019-10-07 ENCOUNTER — Telehealth: Payer: Self-pay

## 2019-10-07 NOTE — Telephone Encounter (Signed)
Called and spoke to the pt directly regarding his recent lab results. Per Np JM. Patient demonstrated understanding and had no questions.   "Please advise patient that his recent lab work was all satisfactory" -NP Ihor Austin

## 2019-10-09 ENCOUNTER — Other Ambulatory Visit: Payer: Self-pay

## 2019-10-09 ENCOUNTER — Ambulatory Visit: Payer: Self-pay | Admitting: Physician Assistant

## 2019-10-09 VITALS — BP 120/88 | HR 92 | Temp 98.2°F | Ht 68.0 in | Wt 201.8 lb

## 2019-10-09 DIAGNOSIS — I63211 Cerebral infarction due to unspecified occlusion or stenosis of right vertebral arteries: Secondary | ICD-10-CM

## 2019-10-09 NOTE — Progress Notes (Signed)
   Subjective: Status post CVA    Patient ID: Henry Powell, male    DOB: 10-25-1980, 39 y.o.   MRN: [id number]  HPI Patient presents for reevaluation status post CVA which occurred on 08/21/2019.  Patient has been evaluated by urology on 10/02/2019.  Patient has returned to light duty.  Patient is prescribed Norvasc, atorvastatin, Plavix, and aspirin.  Patient state no residual effects since starting medications.   Review of Systems Hypertension.    Objective:   Physical Exam HEENT was grossly unremarkable.  Neck is supple without adenopathy.  Lungs are clear to auscultation heart regular rate and rhythm.  Cranial nerves II through XII are grossly intact.       Assessment & Plan: Resolving CVA.  Advised to continue previous medications.  Continue light duty.  Follow-up for repeat CAT scan as scheduled.  Follow-up with neurology per schedule.  Return to ED after evaluation by neurology.Marland Kitchen

## 2019-10-23 ENCOUNTER — Telehealth: Payer: Self-pay | Admitting: Adult Health

## 2019-10-23 DIAGNOSIS — R7989 Other specified abnormal findings of blood chemistry: Secondary | ICD-10-CM

## 2019-10-23 DIAGNOSIS — I7774 Dissection of vertebral artery: Secondary | ICD-10-CM

## 2019-10-23 NOTE — Telephone Encounter (Signed)
Pt called stating that he was told that a CT Scan order was going to be put in for him and he is wanting to know what the update is on that. Please advise.

## 2019-10-23 NOTE — Telephone Encounter (Signed)
Order placed to repeat CTA head to assess for known vertebral artery dissection.  Prior to imaging, we will need to repeat lab work to ensure satisfactory kidney function.  If he is willing to come to our office to have lab work done, please let me know and orders will be placed.

## 2019-10-24 ENCOUNTER — Telehealth: Payer: Self-pay | Admitting: Adult Health

## 2019-10-24 NOTE — Telephone Encounter (Signed)
Lab order placed to obtain creatinine and BUN prior to CTA.  He is aware to have lab work completed prior to imaging to ensure satisfactory kidney level as prior creatinine level slightly elevated.

## 2019-10-24 NOTE — Addendum Note (Signed)
Addended by: Raliegh Ip on: 10/24/2019 10:15 AM   Modules accepted: Orders

## 2019-10-24 NOTE — Telephone Encounter (Signed)
[employer] order sent to GI. They obtain the auth and reach out to the patient to schedule.  

## 2019-10-24 NOTE — Telephone Encounter (Signed)
I called pt and he is ok to have lab done.  He is out to [place] and will be back 10-30-19.  I relayed that they will contact him to schedule CTA, so as long has done prior to that.  He verbalized understanding.

## 2019-11-04 ENCOUNTER — Other Ambulatory Visit (INDEPENDENT_AMBULATORY_CARE_PROVIDER_SITE_OTHER): Payer: Self-pay

## 2019-11-04 DIAGNOSIS — Z0289 Encounter for other administrative examinations: Secondary | ICD-10-CM

## 2019-11-04 DIAGNOSIS — I7774 Dissection of vertebral artery: Secondary | ICD-10-CM | POA: Diagnosis not present

## 2019-11-04 DIAGNOSIS — R7989 Other specified abnormal findings of blood chemistry: Secondary | ICD-10-CM | POA: Diagnosis not present

## 2019-11-04 NOTE — Telephone Encounter (Signed)
Henry Powell: [id number] (exp. 10/31/19 to 04/28/20) patient is scheduled at GI for 11/07/19.

## 2019-11-05 LAB — BUN+CREAT
BUN/Creatinine Ratio: 12 (ref 9–20)
BUN: 15 mg/dL (ref 6–20)
Creatinine, Ser: 1.23 mg/dL (ref 0.76–1.27)
GFR calc Af Amer: 87 mL/min/{1.73_m2} (ref >59)
GFR calc non Af Amer: 75 mL/min/{1.73_m2} (ref >59)

## 2019-11-07 ENCOUNTER — Ambulatory Visit
Admission: RE | Admit: 2019-11-07 | Discharge: 2019-11-07 | Disposition: A | Discharge status: Home / Self Care | Payer: 59 | Source: Ambulatory Visit | Attending: Adult Health | Admitting: Adult Health

## 2019-11-07 DIAGNOSIS — I7774 Dissection of vertebral artery: Secondary | ICD-10-CM

## 2019-11-07 MED ORDER — IOPAMIDOL (ISOVUE-370) INJECTION 76%
75.0000 mL | Freq: Once | INTRAVENOUS | Status: AC | PRN
  Administered 2019-11-07: 75 mL via INTRAVENOUS

## 2019-11-11 ENCOUNTER — Telehealth: Payer: Self-pay | Admitting: *Deleted

## 2019-11-11 NOTE — Telephone Encounter (Signed)
Pt returned call. Please call back when available. 

## 2019-11-11 NOTE — Telephone Encounter (Signed)
I called mobile, unable to LM VM full.

## 2019-11-11 NOTE — Telephone Encounter (Signed)
-----   Message from Henry McCue, NP sent at 11/11/2019  7:31 AM EST ----- Please advise patient that recent imaging showed resolution of prior vertebral artery dissection. 

## 2019-11-11 NOTE — Telephone Encounter (Signed)
-----   Message from Ihor Austin, NP sent at 11/11/2019  7:31 AM EST ----- Please advise patient that recent imaging showed resolution of prior vertebral artery dissection.

## 2019-11-11 NOTE — Telephone Encounter (Signed)
I called pt back and relayed that the CTA dissection has resolved/healed.  He has appt thie week,and JM/NP to go over more indepth when in the office.  He verbalized understanding.

## 2019-11-12 NOTE — Progress Notes (Signed)
Kindly inform the patient that lab work for  kidney function was normal on 11/04/2019

## 2019-11-13 ENCOUNTER — Encounter: Payer: Self-pay | Admitting: *Deleted

## 2019-11-14 ENCOUNTER — Ambulatory Visit (INDEPENDENT_AMBULATORY_CARE_PROVIDER_SITE_OTHER): Payer: 59 | Admitting: Adult Health

## 2019-11-14 ENCOUNTER — Other Ambulatory Visit: Payer: Self-pay

## 2019-11-14 ENCOUNTER — Encounter: Payer: Self-pay | Admitting: Adult Health

## 2019-11-14 VITALS — BP 128/84 | HR 71 | Temp 97.5°F | Ht 68.0 in | Wt 197.8 lb

## 2019-11-14 DIAGNOSIS — E785 Hyperlipidemia, unspecified: Secondary | ICD-10-CM

## 2019-11-14 DIAGNOSIS — R42 Dizziness and giddiness: Secondary | ICD-10-CM

## 2019-11-14 DIAGNOSIS — I7774 Dissection of vertebral artery: Secondary | ICD-10-CM | POA: Diagnosis not present

## 2019-11-14 DIAGNOSIS — I69398 Other sequelae of cerebral infarction: Secondary | ICD-10-CM | POA: Diagnosis not present

## 2019-11-14 DIAGNOSIS — I639 Cerebral infarction, unspecified: Secondary | ICD-10-CM | POA: Diagnosis not present

## 2019-11-14 DIAGNOSIS — I10 Essential (primary) hypertension: Secondary | ICD-10-CM

## 2019-11-14 DIAGNOSIS — E538 Deficiency of other specified B group vitamins: Secondary | ICD-10-CM

## 2019-11-14 MED ORDER — CYANOCOBALAMIN 1000 MCG PO TABS: 1000.0000 ug | ORAL_TABLET | Freq: Every day | ORAL | 2 refills | Status: AC

## 2019-11-14 MED ORDER — AMLODIPINE BESYLATE 5 MG PO TABS: 5.0000 mg | ORAL_TABLET | Freq: Every day | ORAL | 3 refills | Status: DC

## 2019-11-14 MED ORDER — ATORVASTATIN CALCIUM 80 MG PO TABS: 80.0000 mg | ORAL_TABLET | Freq: Every day | ORAL | 3 refills | Status: DC

## 2019-11-14 MED ORDER — ESCITALOPRAM OXALATE 10 MG PO TABS: 10.0000 mg | ORAL_TABLET | Freq: Every day | ORAL | 1 refills | Status: DC

## 2019-11-14 NOTE — Progress Notes (Signed)
I agree with the above plan 

## 2019-11-14 NOTE — Patient Instructions (Signed)
Continue aspirin 81 mg daily  and lipitor  for secondary stroke prevention  Continue to follow up with PCP regarding cholesterol and blood pressure management   Will repeat lab work at follow up visit  Continue lexapro 10mg  night for ongoing management of anxiety  Continue to monitor blood pressure at home  Maintain strict control of hypertension with blood pressure goal below 130/90, diabetes with hemoglobin A1c goal below 6.5% and cholesterol with LDL cholesterol (bad cholesterol) goal below 70 mg/dL. I also advised the patient to eat a healthy diet with plenty of whole grains, cereals, fruits and vegetables, exercise regularly and maintain ideal body weight.  Followup in the future with me in 3 months or call earlier if needed       Thank you for coming to see at [facility]. I hope we have been able to provide you high quality care today.  You may receive a patient satisfaction survey over the next few weeks. We would appreciate your feedback and comments so that we may continue to improve ourselves and the health of our patients.

## 2019-11-14 NOTE — Progress Notes (Signed)
[facility] [address] [place]. [place] [address] [phone]       STROKE FOLLOW UP NOTE  Mr. Kennis Wissmann Date of Birth:  1981/03/13 Medical Record Number:  [id number]   Reason for Referral: stroke follow up    CHIEF COMPLAINT:  Chief Complaint  Patient presents with  . Follow-up    6 week f/u. Mother present. Rm 9. Patient mentioned that he still has some dizziness. He stated that the last time he was dizzy was last week.     HPI:  Mr. Mazzocco is a 39 year old being seen today, 11/14/2019, who is being seen today for stroke follow up as well as potentially returning back to work without restrictions. Since prior visit, he has been stable from a stroke standpoint with residual occasional dizziness but overall improving with decreasing occurrences.  Repeat CTA on 11/07/2019 showed resolution of prior dissection without underlying stenosis or chronic dissection.  Repeat homocystine level 12.4 which was elevated during admission.  Repeat B12 871 currently on supplementation for evidence of deficiency during admission.  During admission he continues on aspirin and Plavix which will be completed in the next week for total 65-month duration and denies bleeding or bruising.  Continues on atorvastatin without myalgias.  Repeat lipid panel showed excellent improvement of LDL at 79 (previously 168).  Blood pressure today 128/84.  Initiated Lexapro after prior visit for poststroke anxiety with great improvement.  No further concerns at this time.    History copied for reference purposes only Stroke admission 08/21/2019: Mr.Shakir Daltonis a 38 y.o.malewith history of diet controlled HTN[facility] 08/17/2019 while sweeping.  MRI revealed fairly large cerebellar stroke and transferred to [facility] for further monitoring given concern for edema.  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  2D echo showed an EF of 55 to 60% without cardiac source of embolus identified.  TCD w/ bubble negative for PFO. LE Dopplers negative for DVT.  Hypercoagulable labs with some resulted negative but some pending at discharge.  Recommended DAPT for 3 months then aspirin alone.  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  Cerebral edema with induced hyponatremia treated with 3% with stabilization on repeat imaging.  Hypertensive urgency upon arrival with BP 184/114 treated with Cleviprex and initiated amlodipine 5 mg daily with long-term BP normotensive range.  LDL 168 and initiated atorvastatin 80 mg daily.  Current tobacco use with smoking cessation counseling provided.  Alcohol use reporting 7-8 drinks per day with alcohol abuse counseling provided.  B12 deficiency noted and initiated B12 supplement.  Other stroke risk factors include overweight and family history of stroke but no personal history of stroke.  He was discharged home in stable condition.  Initial visit 10/02/2019: Mr. Seawood is a 39 year old male who is being seen today for hospital follow-up accompanied by his mother.  Residual deficits of occasional dizziness, mild fatigue and generalized headaches but endorses ongoing improvement.  Reports headaches are generalized and dull type sensation with pain level 3/10 and will take Tylenol with benefit.  Headaches are not daily and are not associated with any other symptoms.  Denies neck pain.  Dizziness typically occurs while bending over or quickly  changing positions.  Sensation lasts only a couple seconds and then subsides.  Denies any difficulty with balance or gait.  He also endorses anxiety post stroke without prior anxiety or depression.  He has  returned to work on a part-time basis until today's follow-up for possible return for full-time.  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  He does not endorse difficulty with performing job duties with part-time work.  He does live alone and has split custody of his 51 and 13-year-old children.  Able to maintain ADLs and IADLs independently without difficulty.  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  He has not returned back to country club or [employer] at this time.  He has continued on aspirin and Plavix without bleeding or bruising.  Continues on atorvastatin without myalgias.  Blood pressure today 136/95.  He endorses complete cessation of smokeless tobacco since hospital discharge as well as alcohol use.  He is also continued on cyanocobalamin 1000 mcg daily for B12 deficiency.  Further review of lab work obtained during admission as some results are still pending in regards to hypercoagulable work-up.  Homocysteine level was slightly elevated at 16.4.  All other within normal limits.  Denies new or worsening stroke/TIA symptoms.        ROS:   14 system review of systems performed and negative with exception of dizziness  PMH:  Past Medical History:  Diagnosis Date  . Hypertension   . Stroke [facility])     PSH:  Past Surgical History:  Procedure Laterality Date  . NO PAST SURGERIES      Social History:  Social History   Socioeconomic History  . Marital status: Divorced    Spouse name: Not on file  . Number of children: Not on file  . Years of education: Not on file  . Highest education level: Not on file  Occupational History  . Not on file  Tobacco Use  . Smoking status: Never Smoker  . Smokeless tobacco: Current User    Types: Chew  Substance and Sexual Activity  . Alcohol use: Yes    Alcohol/week: 56.0 standard drinks    Types: 56 Cans of beer per  week  . Drug use: Never  . Sexual activity: Not on file  Other Topics Concern  . Not on file  Social History Narrative  . Not on file   Social Determinants of Health   Financial Resource Strain:   . Difficulty of Paying Living Expenses: Not on file  Food Insecurity:   . Worried About Programme researcher, broadcasting/film/video in the Last Year: Not on file  . Ran Out of Food in the Last Year: Not on file  Transportation Needs:   . Lack of Transportation (Medical): Not on file  . Lack of Transportation (Non-Medical): Not on file  Physical Activity:   . Days of Exercise per Week: Not on file  . Minutes of Exercise per Session: Not on file  Stress:   . Feeling of Stress : Not on file  Social Connections:   . Frequency of Communication with Friends and Family: Not on file  . Frequency of Social Gatherings with Friends and Family: Not on file  . Attends Religious Services: Not on file  . Active Member of Clubs or Organizations: Not on file  . Attends Banker Meetings: Not on file  . Marital Status:  Not on file  Intimate Partner Violence:   . Fear of Current or Ex-Partner: Not on file  . Emotionally Abused: Not on file  . Physically Abused: Not on file  . Sexually Abused: Not on file    Family History:  Family History  Problem Relation Age of Onset  . Hypertension Mother   . Diabetes Mother   . Hypertension Father   . Stroke Paternal Uncle   . Stroke Paternal Uncle     Medications:   No current outpatient medications on file prior to visit.   No current facility-administered medications on file prior to visit.    Allergies:  No Known Allergies   Physical Exam  Vitals:   11/14/19 0909  BP: 128/84  Pulse: 71  Temp: (!) 97.5 F (36.4 C)  TempSrc: Oral  Weight: 197 lb 12.8 oz (89.7 kg)  Height: 5\' 8"  (1.727 m)   Body mass index is 30.08 kg/m. No exam data present  General: well developed, well nourished,  pleasant middle-aged Caucasian male, seated, in no evident  distress Head: head normocephalic and atraumatic.   Neck: supple with no carotid or supraclavicular bruits Cardiovascular: regular rate and rhythm, no murmurs Musculoskeletal: no deformity Skin:  no rash/petichiae Vascular:  Normal pulses all extremities   Neurologic Exam Mental Status: Awake and fully alert.   Normal speech and language.  Oriented to place and time. Recent and remote memory intact. Attention span, concentration and fund of knowledge appropriate. Mood and affect appropriate.  Cranial Nerves: Pupils equal, briskly reactive to light. Extraocular movements full without nystagmus. Visual fields full to confrontation. Hearing intact. Facial sensation intact. Face, tongue, palate moves normally and symmetrically.  Motor: Normal bulk and tone. Normal strength in all tested extremity muscles. Sensory.: intact to touch , pinprick , position and vibratory sensation.  Coordination: Rapid alternating movements normal in all extremities. Finger-to-nose and heel-to-shin performed accurately bilaterally. Gait and Station: Arises from chair without difficulty. Stance is normal. Gait demonstrates normal stride length and balance.  Able to tandem gait, toe raise on toes and go back on heels without difficulty.  Romberg negative. Reflexes: 1+ and symmetric. Toes downgoing.       Diagnostic Data (Labs, Imaging, Testing)   CT ANGIO HEAD W OR WO CONTRAST CT ANGIO NECK W OR WO CONTRAST 08/22/2019 1. 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 Given age, an underlying dissection is a strong consideration. No findings of fibromuscular dysplasia in the other vessels. 2. Mild atheromatous plaque at the ICA bulbs without stenosis or ulceration. 3. Stable extent of right cerebellar infarct.  No hydrocephalus.   11/07/2019 IMPRESSION: 1. Normal variant CTA circle Willis without significant proximal stenosis, aneurysm, or branch vessel occlusion. 2. Remote  infarct of the right inferior cerebellum. 3. Previously noted attenuation in the right vertebral artery has resolved. No focal underlying stenosis or chronic dissection is present.   MR Brain W and Wo Contrast 08/21/2019 Acute infarction affecting the inferior cerebellum on the right, PICA distribution. Swelling but no hemorrhagic transformation. Non dominant right vertebral artery appears to show at least some flow.  Lipid Panel     Component Value Date/Time   CHOL 136 10/02/2019 0918   TRIG 119 10/02/2019 0918   HDL 35 (L) 10/02/2019 0918   CHOLHDL 3.9 10/02/2019 0918   CHOLHDL 7.0 08/22/2019 0541   VLDL 31 08/22/2019 0541   LDLCALC 79 10/02/2019 0918   LABVLDL 22 10/02/2019 0865  ASSESSMENT: Henry Powell is a 39 y.o. 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 Vascular risk factors include HTN, HLD, tobacco use, alcohol abuse, vitamin B-12 deficiency and family history of stroke.  Residual deficits of intermittent dizziness which has improved with less occurrences.  Repeat CTA showed resolution of prior dissection    PLAN:  1. Right cerebellar stroke: Continue aspirin 81 mg daily  and atorvastatin 80 mg daily for secondary stroke prevention.  Does have 1 additional dose of Plavix and advised no additional refills needed as ongoing DAPT not indicated.  Maintain strict control of hypertension with blood pressure goal below 130/90, diabetes with hemoglobin A1c goal below 6.5% and cholesterol with LDL cholesterol (bad cholesterol) goal below 70 mg/dL.  I also advised the patient to eat a healthy diet with plenty of whole grains, cereals, fruits and vegetables, exercise regularly with at least 30 minutes of continuous activity daily and maintain ideal body weight. 2. Right VA dissection: Repeat imaging showed resolution of prior dissection 3. HTN: Advised to  continue current treatment regimen.  Today's BP stable.  Advised to continue to monitor at home along with continued follow-up with PCP for management 4. HLD: Advised to continue current treatment regimen along with continued follow-up with PCP for future prescribing and monitoring of lipid panel.  We will repeat lab work at today's visit 5. B12 deficiency: Continuation of supplementation 1000 mcg daily.  Recent lab work satisfactory.  We will repeat at follow-up visit 6. Elevated homocystine: Resolved 7. Reactive anxiety, post stroke: Continuation of Lexapro 10 mg daily for ongoing improvement of poststroke anxiety 8. Continue full-time but on light duty at this time due to continued intermittent dizziness episodes.  Was provided a work note and will reassess at follow-up visit in 3 months.  Advised to call office if he wishes to return without restrictions prior to follow-up visit. 9. Patient does have appointment next week to establish care with PCP.  Refilled current medications but advised ongoing refills and management will need to be done by PCP    Follow up in 3 months or call earlier if needed   I spent 30 minutes of face-to-face and non-face-to-face time with patient.  This included previsit chart review, lab review, study review, order entry, electronic health record documentation, patient education     Ihor Austin, [facility]  [facility] [address] [place], [place] [address]  Phone [phone] Fax [phone] Note: This document was prepared with digital dictation and possible smart phrase technology. Any transcriptional errors that result from this process are unintentional.

## 2019-11-20 ENCOUNTER — Ambulatory Visit: Payer: Self-pay | Admitting: Physician Assistant

## 2019-11-20 ENCOUNTER — Other Ambulatory Visit: Payer: Self-pay

## 2019-11-20 VITALS — BP 138/90 | HR 91 | Temp 98.0°F | Wt 197.2 lb

## 2019-11-20 DIAGNOSIS — I63211 Cerebral infarction due to unspecified occlusion or stenosis of right vertebral arteries: Secondary | ICD-10-CM

## 2019-11-20 NOTE — Progress Notes (Signed)
   Subjective: CVA    Patient ID: Henry Powell, male    DOB: [birth year], 39 y.o.   MRN: [id number]  HPI Patient status post 2 and half weeks CVA.  Patient last evaluation by neurosurgeon was was November 14, 2019.  Patient is progressing well but still have residual vertigo.  Patient was advised continue previous medication except for Plavix.  Patient return back to modified duty and will have evaluation again by neurology in 3 months.  Review of Systems    CVA and hypertension. Objective:   Physical Exam Patient appears no acute distress.  HEENT was grossly unremarkable.  Neck was supple for adenopathy or bruits.  Lungs were clear to auscultation heart regular rate and rhythm.  Patient had normal strength in all extremities.  Sensation was intact.  Cranial nerves II through XII grossly intact.       Assessment & Plan: Resolving CVA.  Patient continues here residual vertigo status post CVA.  Patient will continue restricted duty and discontinue Plavix as directed.  Continue previous medications.  Patient advised to follow-up status post next neuro visit last condition worsens.

## 2019-11-26 DIAGNOSIS — Z03818 Encounter for observation for suspected exposure to other biological agents ruled out: Secondary | ICD-10-CM | POA: Diagnosis not present

## 2019-11-26 DIAGNOSIS — Z8673 Personal history of transient ischemic attack (TIA), and cerebral infarction without residual deficits: Secondary | ICD-10-CM | POA: Diagnosis not present

## 2019-11-26 DIAGNOSIS — Z7189 Other specified counseling: Secondary | ICD-10-CM | POA: Diagnosis not present

## 2019-11-26 DIAGNOSIS — U071 COVID-19: Secondary | ICD-10-CM | POA: Diagnosis not present

## 2019-11-27 ENCOUNTER — Telehealth: Payer: Self-pay | Admitting: Adult Health

## 2019-11-27 ENCOUNTER — Telehealth: Payer: Self-pay | Admitting: Neurology

## 2019-11-27 NOTE — Telephone Encounter (Signed)
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  Repeat imaging showed complete resolution of dissection.  He completed 3 months DAPT and on aspirin alone at this time. I do not believe he would benefit from restarting Plavix but I will leave final decision to Dr. Roda Shutters or Dr. Pearlean Brownie.

## 2019-11-27 NOTE — Telephone Encounter (Signed)
Patient called yesterday on 11/26/2019 through the after hours call service with a question regarding his blood thinner.  He reports that he was recently told to stop the Plavix and just take baby aspirin daily for prevention. He was on DAPT for about 3 months, I reviewed the office note from 11/14/19, when he saw [place]. He has been diagnosed with COVID-19, he is exhibiting symptoms of mild cough, no fever, no new neurological symptoms.  The urgent care doctor told him to check with [place] whether or not he should restart his Plavix for prevention of any blood clots.  The doctor also advised him to increase his baby aspirin to a full size aspirin daily.  I spoke to the patient.  I did advise him that it should be okay for him to take a adult size aspirin daily.  As far as restarting Plavix, I am not sure that he needs to go back on Plavix.  I told him that I would check with the stroke specialists.   Shanda Bumps, please call back to advise patient, if he should restart the Plavix. Dr. Pearlean Brownie or Roda Shutters, can you weigh in?

## 2019-11-27 NOTE — Telephone Encounter (Signed)
Thank you for the message. Pt has completed 3 months course of DAPT and repeat imaging should resolution of dissection. Further DAPT is not necessary and agree with ASA alone for long term stroke prevention. ASA 325 is a reasonable choice if pt long term bleeding risk is low.   In terms of COVID diagnosis, anticoagulation has been used in hospitalized COVID patient with high D-dimers for DVT and PE prevention. There is no evidence or practice, as far as I know, of anticoagulation in non-hospitalized COVID patient nor antiplatelet therapy in any COVID patient for thrombosis prevention. Specifically, no evidence of DAPT for stroke prevention in COVID patients with prior history of stroke. Therefore, I agree with Shanda Bumps and Dr. Frances Furbish that plavix is not needed at this time for stroke prevention.   Thank you for reaching out.  Marvel Plan, MD PhD Stroke Neurology 11/27/2019 7:52 PM

## 2019-11-27 NOTE — Telephone Encounter (Signed)
Contacted patient in regard to question of starting plavix in addition to aspirin due to recent diagnosis of Covid.  Message was sent to both Dr. Roda Shutters and Dr. Pearlean Brownie in regards to need of DAPT.  I did advise him at this time to just continue on full dose aspirin and I will be contacting him again tomorrow afternoon to advise him if Plavix needs to be restarted as well.  He states the doctor he saw for Covid diagnosis said that his daughter who is the same age as the patient also had a prior stroke due to dissection and when she was found to have Covid, she was placed on DAPT.  He is currently feeling well with only mild congestion.  He was appreciative of phone call and answered all questions to satisfaction.

## 2019-11-28 MED ORDER — ESCITALOPRAM OXALATE 20 MG PO TABS: 20.0000 mg | ORAL_TABLET | Freq: Every day | ORAL | 3 refills | Status: DC

## 2019-11-28 NOTE — Telephone Encounter (Signed)
Called patient back with Dr. Warren Danes recommendations regarding question of needing DAPT with recent Covid diagnosis.  As he satisfactorily completed 3-month DAPT post dissection and recent imaging showed resolution, no indication to initiate Plavix in addition to aspirin.  Advised him to continue aspirin 325 mg daily (increased from 81 mg by PCP) only at this time.  He also states increased anxiety due to Covid diagnosis and question if he could increase Lexapro from 10 mg to 20 mg daily.  Encouraged him to do so at this time and to call office or follow-up with PCP if he continues to experience increased anxiety.  He was appreciative of phone call and answered all questions to patient satisfaction.

## 2019-11-28 NOTE — Telephone Encounter (Signed)
Thank you for the input. Greatly appreciated! Hope all is well!

## 2019-11-28 NOTE — Telephone Encounter (Signed)
Thank you, Dr. Roda Shutters for your input; very informative. Will help to advise patients, as these questions will come up more, I am sure.

## 2019-11-28 NOTE — Addendum Note (Signed)
Addended by: Raliegh Ip on: 11/28/2019 01:24 PM   Modules accepted: Orders

## 2019-12-17 NOTE — Telephone Encounter (Signed)
Thanks. Agree with plan as stated

## 2020-02-17 ENCOUNTER — Other Ambulatory Visit: Payer: Self-pay

## 2020-02-18 NOTE — Progress Notes (Signed)
Scheduled to complete physical 02/24/20 with Bridget Hartshorn, PA-C.  AMD

## 2020-02-19 ENCOUNTER — Other Ambulatory Visit: Payer: Self-pay

## 2020-02-19 DIAGNOSIS — Z0000 Encounter for general adult medical examination without abnormal findings: Secondary | ICD-10-CM

## 2020-02-19 LAB — POCT URINALYSIS DIPSTICK
Bilirubin, UA: NEGATIVE
Blood, UA: NEGATIVE
Glucose, UA: NEGATIVE
Ketones, UA: NEGATIVE
Leukocytes, UA: NEGATIVE
Nitrite, UA: NEGATIVE
Protein, UA: NEGATIVE
Spec Grav, UA: 1.015 (ref 1.010–1.025)
Urobilinogen, UA: 0.2 E.U./dL
pH, UA: 7 (ref 5.0–8.0)

## 2020-02-20 ENCOUNTER — Encounter: Payer: Self-pay | Admitting: Adult Health

## 2020-02-20 ENCOUNTER — Ambulatory Visit (INDEPENDENT_AMBULATORY_CARE_PROVIDER_SITE_OTHER): Payer: Self-pay | Admitting: Adult Health

## 2020-02-20 VITALS — BP 153/109 | HR 80 | Ht 68.0 in | Wt 203.0 lb

## 2020-02-20 DIAGNOSIS — E785 Hyperlipidemia, unspecified: Secondary | ICD-10-CM | POA: Diagnosis not present

## 2020-02-20 DIAGNOSIS — I10 Essential (primary) hypertension: Secondary | ICD-10-CM

## 2020-02-20 DIAGNOSIS — I7774 Dissection of vertebral artery: Secondary | ICD-10-CM

## 2020-02-20 DIAGNOSIS — E538 Deficiency of other specified B group vitamins: Secondary | ICD-10-CM

## 2020-02-20 DIAGNOSIS — I639 Cerebral infarction, unspecified: Secondary | ICD-10-CM

## 2020-02-20 DIAGNOSIS — F0630 Mood disorder due to known physiological condition, unspecified: Secondary | ICD-10-CM

## 2020-02-20 DIAGNOSIS — I69398 Other sequelae of cerebral infarction: Secondary | ICD-10-CM

## 2020-02-20 LAB — CMP12+LP+TP+TSH+6AC+CBC/D/PLT
ALT: 59 IU/L — ABNORMAL HIGH (ref 0–44)
AST: 31 IU/L (ref 0–40)
Albumin/Globulin Ratio: 2.3 — ABNORMAL HIGH (ref 1.2–2.2)
Albumin: 5.2 g/dL — ABNORMAL HIGH (ref 4.0–5.0)
Alkaline Phosphatase: 90 IU/L (ref 48–121)
BUN/Creatinine Ratio: 15 (ref 9–20)
BUN: 17 mg/dL (ref 6–20)
Basophils Absolute: 0.1 10*3/uL (ref 0.0–0.2)
Basos: 1 %
Bilirubin Total: 0.6 mg/dL (ref 0.0–1.2)
Calcium: 9.6 mg/dL (ref 8.7–10.2)
Chloride: 96 mmol/L (ref 96–106)
Chol/HDL Ratio: 4.1 ratio (ref 0.0–5.0)
Cholesterol, Total: 205 mg/dL — ABNORMAL HIGH (ref 100–199)
Creatinine, Ser: 1.12 mg/dL (ref 0.76–1.27)
EOS (ABSOLUTE): 0.1 10*3/uL (ref 0.0–0.4)
Eos: 2 %
Estimated CHD Risk: 0.8 times avg. (ref 0.0–1.0)
Free Thyroxine Index: 1.5 (ref 1.2–4.9)
GFR calc Af Amer: 96 mL/min/{1.73_m2} (ref >59)
GFR calc non Af Amer: 83 mL/min/{1.73_m2} (ref >59)
GGT: 50 IU/L (ref 0–65)
Globulin, Total: 2.3 g/dL (ref 1.5–4.5)
Glucose: 111 mg/dL — ABNORMAL HIGH (ref 65–99)
HDL: 50 mg/dL (ref >39)
Hematocrit: 42.2 % (ref 37.5–51.0)
Hemoglobin: 14.7 g/dL (ref 13.0–17.7)
Immature Grans (Abs): 0 10*3/uL (ref 0.0–0.1)
Immature Granulocytes: 0 %
Iron: 114 ug/dL (ref 38–169)
LDH: 186 IU/L (ref 121–224)
LDL Chol Calc (NIH): 134 mg/dL — ABNORMAL HIGH (ref 0–99)
Lymphocytes Absolute: 2.1 10*3/uL (ref 0.7–3.1)
Lymphs: 33 %
MCH: 30.4 pg (ref 26.6–33.0)
MCHC: 34.8 g/dL (ref 31.5–35.7)
MCV: 87 fL (ref 79–97)
Monocytes Absolute: 0.6 10*3/uL (ref 0.1–0.9)
Monocytes: 10 %
Neutrophils Absolute: 3.4 10*3/uL (ref 1.4–7.0)
Neutrophils: 54 %
Phosphorus: 3.2 mg/dL (ref 2.8–4.1)
Platelets: 283 10*3/uL (ref 150–450)
Potassium: 4.5 mmol/L (ref 3.5–5.2)
RBC: 4.84 x10E6/uL (ref 4.14–5.80)
RDW: 14.1 % (ref 11.6–15.4)
Sodium: 139 mmol/L (ref 134–144)
T3 Uptake Ratio: 22 % — ABNORMAL LOW (ref 24–39)
T4, Total: 6.9 ug/dL (ref 4.5–12.0)
TSH: 2.38 u[IU]/mL (ref 0.450–4.500)
Total Protein: 7.5 g/dL (ref 6.0–8.5)
Triglycerides: 119 mg/dL (ref 0–149)
Uric Acid: 5.8 mg/dL (ref 3.8–8.4)
VLDL Cholesterol Cal: 21 mg/dL (ref 5–40)
WBC: 6.3 10*3/uL (ref 3.4–10.8)

## 2020-02-20 NOTE — Patient Instructions (Signed)
Continue aspirin 81 mg daily  and atorvastatin 80 mg daily for secondary stroke prevention  Continue to follow up with PCP regarding cholesterol and blood pressure management   We will check cholesterol levels today  Continue B12 supplementation -we will repeat B12 level today  Try to stop lexapro and see how you do - if anxiety worsens, try to cut tablet in half and take for 7 days and then stop - please let me know if you have any difficulty doing this  Maintain strict control of hypertension with blood pressure goal below 130/90, diabetes with hemoglobin A1c goal below 6.5% and cholesterol with LDL cholesterol (bad cholesterol) goal below 70 mg/dL. I also advised the patient to eat a healthy diet with plenty of whole grains, cereals, fruits and vegetables, exercise regularly and maintain ideal body weight.  Followup in the future with me in 6 months or call earlier if needed       Thank you for coming to see [place] at [facility]. I hope we have been able to provide you high quality care today.  You may receive a patient satisfaction survey over the next few weeks. We would appreciate your feedback and comments so that we may continue to improve ourselves and the health of our patients.

## 2020-02-20 NOTE — Progress Notes (Signed)
[facility] [address] [place]. [place] [address] [phone]       STROKE FOLLOW UP NOTE  Henry Powell Date of Birth:  [birth year] Medical Record Number:  [id number]   Reason for Referral: stroke follow up    CHIEF COMPLAINT:  Chief Complaint  Patient presents with  . Follow-up    rm 9 here for a stroke f/u.    HPI:  Today, 02/20/2020, Henry Powell returns for stroke follow-up.  He has been stable since prior visit. He continues to have occasional dizziness but improving and able to compensate better.  He continues on aspirin and atorvastatin 80 mg daily for secondary stroke prevention.  Blood pressure today 153/109 -does not routinely monitor at home but does endorse compliance with amlodipine.  Anxiety post stroke greatly improved and only occasionally takes Lexapro without any worsening of symptoms.  Continues on B12 supplementation for evidence of B12 deficiency during admission with prior lab work satisfactory.  Prior heavy alcohol use approximately 10 beers per day which she has since decreased usually around 3 to 4/day depending on the day and other days he may have none.  [facility] [employer] physicians and balance physical next week.  No concerns at this time.     History provided for reference purposes only Update 11/14/2019 JM: Henry Powell is a 39 year old being seen today, 11/14/2019, who is being seen today for stroke follow up as well as potentially returning back to work without restrictions. Since prior visit, he has been stable from a stroke standpoint with residual occasional dizziness but overall improving with decreasing occurrences.  Repeat CTA on 11/07/2019 showed resolution of prior dissection without underlying stenosis or chronic dissection.  Repeat homocystine level 12.4 which was elevated during admission.  Repeat B12 871 currently on supplementation for evidence of deficiency during admission.  During admission he continues on aspirin and  Plavix which will be completed in the next week for total 40-month duration and denies bleeding or bruising.  Continues on atorvastatin without myalgias.  Repeat lipid panel showed excellent improvement of LDL at 79 (previously 168).  Blood pressure today 128/84.  Initiated Lexapro after prior visit for poststroke anxiety with great improvement.  No further concerns at this time.  Initial visit 10/02/2019 JM: Henry Powell is a 39 year old male who is being seen today for hospital follow-up accompanied by his mother.  Residual deficits of occasional dizziness, mild fatigue and generalized headaches but endorses ongoing improvement.  Reports headaches are generalized and dull type sensation with pain level 3/10 and will take Tylenol with benefit.  Headaches are not daily and are not associated with any other symptoms.  Denies neck pain.  Dizziness typically occurs while bending over or quickly changing positions.  Sensation lasts only a couple seconds and then subsides.  Denies any difficulty with balance or gait.  He also endorses anxiety post stroke without prior anxiety or depression.  He has returned to work on a part-time basis until today's follow-up for possible return for full-time.  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  He does not endorse difficulty with performing job duties with part-time work.  He does live alone and has split custody of his 54 and 53-year-old children.  Able to maintain ADLs and IADLs independently without difficulty.  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  He has not returned back to  country club or [employer] at this time.  He has continued on aspirin and Plavix without bleeding or bruising.  Continues on atorvastatin without myalgias.  Blood pressure today 136/95.  He endorses complete cessation of smokeless tobacco since hospital discharge as well as alcohol  use.  He is also continued on cyanocobalamin 1000 mcg daily for B12 deficiency.  Further review of lab work obtained during admission as some results are still pending in regards to hypercoagulable work-up.  Homocysteine level was slightly elevated at 16.4.  All other within normal limits.  Denies new or worsening stroke/TIA symptoms.  Stroke admission 08/21/2019: HenryMeshilem Daltonis a 38 y.o.malewith history of diet controlled HTNpresented to [facility] with sudden onset dizziness on 08/17/2019 while sweeping.  MRI revealed fairly large cerebellar stroke and transferred to [facility] for further monitoring given concern for edema.  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  2D echo showed an EF of 55 to 60% without cardiac source of embolus identified.  TCD w/ bubble negative for PFO. LE Dopplers negative for DVT.  Hypercoagulable labs with some resulted negative but some pending at discharge.  Recommended DAPT for 3 months then aspirin alone.  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  Cerebral edema with induced hyponatremia treated with 3% with stabilization on repeat imaging.  Hypertensive urgency upon arrival with BP 184/114 treated with Cleviprex and initiated amlodipine 5 mg daily with long-term BP normotensive range.  LDL 168 and initiated atorvastatin 80 mg daily.  Current tobacco use with smoking cessation counseling provided.  Alcohol use reporting 7-8 drinks per day with alcohol abuse counseling provided.  B12 deficiency noted and initiated B12 supplement.  Other stroke risk factors include overweight and family history of stroke but no personal history of stroke.  He was discharged home in stable  condition.        ROS:   14 system review of systems performed and negative with exception of occasional dizziness  PMH:  Past Medical History:  Diagnosis Date  . Hypertension   . Stroke [facility])     PSH:  Past Surgical History:  Procedure Laterality Date  . NO PAST SURGERIES      Social History:  Social History   Socioeconomic History  . Marital status: Divorced    Spouse name: Not on file  . Number of children: Not on file  . Years of education: Not on file  . Highest education level: Not on file  Occupational History  . Not on file  Tobacco Use  . Smoking status: Never Smoker  . Smokeless tobacco: Former Neurosurgeon    Types: Chew  Substance and Sexual Activity  . Alcohol use: Yes    Alcohol/week: 56.0 standard drinks    Types: 56 Cans of beer per week  . Drug use: Never  . Sexual activity: Not on file  Other Topics Concern  . Not on file  Social History Narrative  . Not on file   Social Determinants of Health   Financial Resource Strain:   . Difficulty of Paying Living Expenses:   Food Insecurity:   . Worried About Programme researcher, broadcasting/film/video in the Last Year:   . [employer] of Food in the Last Year:  Transportation Needs:   . Freight forwarder (Medical):   Marland Kitchen Lack of Transportation (Non-Medical):   Physical Activity:   . Days of Exercise per Week:   . Minutes of Exercise per Session:   Stress:   . Feeling of Stress :   Social Connections:   . Frequency of Communication with Friends and Family:   . Frequency of Social Gatherings with Friends and Family:   . Attends Religious Services:   . Active Member of Clubs or Organizations:   . Attends Banker Meetings:   Marland Kitchen Marital Status:   Intimate Partner Violence:   . Fear of Current or Ex-Partner:   . Emotionally Abused:   Marland Kitchen Physically Abused:   . Sexually Abused:     Family History:  Family History  Problem Relation Age of Onset  . Hypertension Mother   . Diabetes Mother   . Hypertension  Father   . Stroke Paternal Uncle   . Stroke Paternal Uncle     Medications:   Current Outpatient Medications on File Prior to Visit  Medication Sig Dispense Refill  . amLODipine (NORVASC) 5 MG tablet Take 1 tablet (5 mg total) by mouth daily. 90 tablet 3  . aspirin 81 MG chewable tablet Chew by mouth daily.    Marland Kitchen atorvastatin (LIPITOR) 80 MG tablet Take 1 tablet (80 mg total) by mouth daily at 6 PM. 90 tablet 3  . cyanocobalamin 1000 MCG tablet Take 1 tablet (1,000 mcg total) by mouth daily. 90 tablet 2  . escitalopram (LEXAPRO) 20 MG tablet Take 1 tablet (20 mg total) by mouth at bedtime. 90 tablet 3   No current facility-administered medications on file prior to visit.    Allergies:  No Known Allergies   Physical Exam  Vitals:   02/20/20 0730  BP: (!) 153/109  Pulse: 80  Weight: 203 lb (92.1 kg)  Height: 5\' 8"  (1.727 m)   Body mass index is 30.87 kg/m. No exam data present  General: well developed, well nourished,  pleasant middle-aged Caucasian male, seated, in no evident distress Head: head normocephalic and atraumatic.   Neck: supple with no carotid or supraclavicular bruits Cardiovascular: regular rate and rhythm, no murmurs Musculoskeletal: no deformity Skin:  no rash/petichiae Vascular:  Normal pulses all extremities   Neurologic Exam Mental Status: Awake and fully alert.   Normal speech and language.  Oriented to place and time. Recent and remote memory intact. Attention span, concentration and fund of knowledge appropriate. Mood and affect appropriate.  Cranial Nerves: Pupils equal, briskly reactive to light. Extraocular movements full without nystagmus. Visual fields full to confrontation. Hearing intact. Facial sensation intact. Face, tongue, palate moves normally and symmetrically.  Motor: Normal bulk and tone. Normal strength in all tested extremity muscles. Sensory.: intact to touch , pinprick , position and vibratory sensation.  Coordination: Rapid  alternating movements normal in all extremities. Finger-to-nose and heel-to-shin performed accurately bilaterally. Gait and Station: Arises from chair without difficulty. Stance is normal. Gait demonstrates normal stride length and balance.  Able to tandem gait, toe raise on toes and go back on heels without difficulty.  Romberg negative. Reflexes: 1+ and symmetric. Toes downgoing.        ASSESSMENT: Henry Powell is a 39 y.o. 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 Vascular risk factors include HTN, HLD, tobacco use, alcohol abuse, vitamin B-12 deficiency on supplementation and family  history of stroke. Repeat CTA showed resolution of prior dissection.  Recovered well from a stroke standpoint with only intermittent vertigo    PLAN:  1. Right cerebellar stroke: Continue aspirin 81 mg daily  and atorvastatin 80 mg daily for secondary stroke prevention. Maintain strict control of hypertension with blood pressure goal below 130/90, diabetes with hemoglobin A1c goal below 6.5% and cholesterol with LDL cholesterol (bad cholesterol) goal below 70 mg/dL.  I also advised the patient to eat a healthy diet with plenty of whole grains, cereals, fruits and vegetables, exercise regularly with at least 30 minutes of continuous activity daily and maintain ideal body weight. 2. Right VA dissection: Resolution of prior imaging -no need for surveillance monitoring or repeat imaging at this time 3. HTN: Elevated today.  Advised to monitor at home and to discuss further with PCP next week for physical if remains elevated. 4. HLD: Continuation of atorvastatin 80 mg daily.  Lipid panel 6 months ago was satisfactory.  We will repeat lipid panel today 5. B12 deficiency: Continuation of supplementation 1000 mcg daily.  Repeat B12 level today 6. Elevated homocystine: Resolved 7. Reactive anxiety,  post stroke: Resolved.  As he only intermittently takes Lexapro, advised to discontinue at this time and to call office with any worsening of symptoms.   Follow up in 6 months or call earlier if needed   I spent 30 minutes of face-to-face and non-face-to-face time with patient.  This included previsit chart review, lab review, study review, order entry, electronic health record documentation, patient education     Frann Rider, [facility]  [facility] [address] [place], [place] [address]  Phone [phone] Fax [phone] Note: This document was prepared with digital dictation and possible smart phrase technology. Any transcriptional errors that result from this process are unintentional.

## 2020-02-21 LAB — LIPID PANEL
Chol/HDL Ratio: 4.3 ratio (ref 0.0–5.0)
Cholesterol, Total: 195 mg/dL (ref 100–199)
HDL: 45 mg/dL (ref >39)
LDL Chol Calc (NIH): 131 mg/dL — ABNORMAL HIGH (ref 0–99)
Triglycerides: 107 mg/dL (ref 0–149)
VLDL Cholesterol Cal: 19 mg/dL (ref 5–40)

## 2020-02-21 LAB — VITAMIN B12: Vitamin B-12: 975 pg/mL (ref 232–1245)

## 2020-02-24 ENCOUNTER — Ambulatory Visit: Payer: Self-pay | Admitting: Emergency Medicine

## 2020-02-24 ENCOUNTER — Encounter: Payer: Self-pay | Admitting: Emergency Medicine

## 2020-02-24 ENCOUNTER — Other Ambulatory Visit: Payer: Self-pay

## 2020-02-24 VITALS — BP 140/100 | HR 99 | Temp 98.6°F | Resp 16 | Ht 68.0 in | Wt 198.0 lb

## 2020-02-24 DIAGNOSIS — Z0000 Encounter for general adult medical examination without abnormal findings: Secondary | ICD-10-CM

## 2020-02-24 DIAGNOSIS — I10 Essential (primary) hypertension: Secondary | ICD-10-CM

## 2020-02-24 NOTE — Progress Notes (Signed)
I have reviewed the triage vital signs and the nursing notes.   HISTORY  Chief Complaint Follow-up (physical and follow up post seizure )  HPI Henry Powell is a 39 y.o. male is here for annual physical.  Patient also reports that he is continuing to follow with [facility] for his ischemic stroke that occurred in December 2020.  Patient states that he feels fine and there is been no continued problems.  He would like to get his diastolic blood pressure down more and they are also looking into his blood pressure at [facility].       Past Medical History:  Diagnosis Date  . Hypertension   . Stroke [facility])     Patient Active Problem List   Diagnosis Date Noted  . 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 Cerebral edema ([place]) 08/26/2019  . Hyperlipidemia 08/26/2019  . B12 deficiency 08/26/2019  . Family hx-stroke 08/26/2019  . Stroke (cerebrum) ([place]) - R cerebellar d/t R VA dissection 08/21/2019  . Cerebellar stroke, acute ([place])   . Hypertensive urgency   . Alcohol abuse     Past Surgical History:  Procedure Laterality Date  . NO PAST SURGERIES      Prior to Admission medications   Medication Sig Start Date End Date Taking? Authorizing Provider  amLODipine (NORVASC) 5 MG tablet Take 1 tablet (5 mg total) by mouth daily. 11/14/19  Yes Frann Rider, NP  aspirin 81 MG chewable tablet Chew by mouth daily.   Yes [provider]  atorvastatin (LIPITOR) 80 MG tablet Take 1 tablet (80 mg total) by mouth daily at 6 PM. 11/14/19  Yes McCue, Janett Billow, NP  cyanocobalamin 1000 MCG tablet Take 1 tablet (1,000 mcg total) by mouth daily. 11/14/19  Yes McCue, Janett Billow, NP  escitalopram (LEXAPRO) 20 MG tablet Take 1 tablet (20 mg total) by mouth at bedtime. 11/28/19  Yes Frann Rider, NP  clopidogrel (PLAVIX) 75 MG tablet  08/26/19   [provider]    Allergies Patient has no known allergies.  Family History  Problem Relation Age of Onset   . Hypertension Mother   . Diabetes Mother   . Hypertension Father   . Stroke Paternal Uncle   . Stroke Paternal Uncle     Social History Social History   Tobacco Use  . Smoking status: Never Smoker  . Smokeless tobacco: Former Systems developer    Types: Chew  Substance Use Topics  . Alcohol use: Yes    Alcohol/week: 56.0 standard drinks    Types: 56 Cans of beer per week  . Drug use: Never    Review of Systems Constitutional: No fever/chills Eyes: No visual changes. Cardiovascular: Denies chest pain. Respiratory: Denies shortness of breath. Gastrointestinal: No abdominal pain.  No nausea, no vomiting.  Musculoskeletal: Negative for muscle or joint pain. Skin: Negative for rash. Neurological: Negative for headaches, focal weakness or numbness. ____________________________________________   PHYSICAL EXAM: Constitutional: Alert and oriented. Well appearing and in no acute distress. Eyes: Conjunctivae are normal. PERRL. EOMI. Head: Atraumatic. Nose: No congestion/rhinnorhea. Neck: No stridor.   Cardiovascular: Normal rate, regular rhythm. Grossly normal heart sounds.  Good peripheral circulation. Respiratory: Normal respiratory effort.  No retractions. Lungs CTAB. Gastrointestinal: Soft and nontender. No distention.  Bowel sounds are normoactive x4 quadrants. Musculoskeletal: Moves upper and lower extremities without any difficulty and normal gait was noted.  There is no edema noted to the lower extremities.  No joint pain. Neurologic:  Normal speech and  language. No gross focal neurologic deficits are appreciated. No gait instability. Skin:  Skin is warm, dry and intact. No rash noted. Psychiatric: Mood and affect are normal. Speech and behavior are normal.  ____________________________________________   LABS (all labs ordered are listed, but only abnormal results are displayed)  Labs were discussed with patient. ____________________________________________   FINAL CLINICAL  IMPRESSION(S)   39 year old is here for an annual physical.  Patient still continues to have elevated diastolic blood pressure.  Patient has been on Norvasc 5 mg for approximately 6 months.  Patient suffered an ischemic cerebral stroke in December.  We discussed options to help lower his blood pressure including exercise, sodium reduction and losing some weight.  He is also interested in coming to the clinic 3 days a week to have his blood pressure checked.  If it remains elevated then we will decide if we will increase his Norvasc or add hydrochlorothiazide to his medication.  Patient takes his medication at night and a diuretic may be a problem.   ED Discharge Orders    None       Note:  This document was prepared using Dragon voice recognition software and may include unintentional dictation errors.

## 2020-02-24 NOTE — Progress Notes (Signed)
I agree with the above plan 

## 2020-02-26 ENCOUNTER — Telehealth: Payer: Self-pay | Admitting: *Deleted

## 2020-02-26 ENCOUNTER — Other Ambulatory Visit: Payer: Self-pay | Admitting: Adult Health

## 2020-02-26 ENCOUNTER — Other Ambulatory Visit: Payer: Self-pay

## 2020-02-26 ENCOUNTER — Ambulatory Visit: Payer: Self-pay

## 2020-02-26 VITALS — BP 153/110 | HR 84 | Resp 16 | Ht 68.0 in | Wt 198.0 lb

## 2020-02-26 DIAGNOSIS — Z0130 Encounter for examination of blood pressure without abnormal findings: Secondary | ICD-10-CM

## 2020-02-26 MED ORDER — EZETIMIBE 10 MG PO TABS
10.0000 mg | ORAL_TABLET | Freq: Every day | ORAL | 3 refills | Status: DC
Start: 2020-02-26 — End: 2021-02-09

## 2020-02-26 NOTE — Progress Notes (Signed)
Continued elevated LDL at 131 with goal less than 70.  Endorses compliance with atorvastatin 80 mg daily therefore recommend initiating Zetia 10 mg daily in addition to high intensity statin.

## 2020-02-26 NOTE — Telephone Encounter (Signed)
-----   Message from Ihor Austin, NP sent at 02/26/2020  8:52 AM EDT ----- Please advise patient that recent lipid panel showed continued elevated LDL (or bad cholesterol) at 131.  Recommend initiating Zetia 10 mg daily in addition to atorvastatin 80 mg daily to help lower LDL with goal of less than 70

## 2020-02-26 NOTE — Telephone Encounter (Signed)
I called pt and gave results of his labs  recent lipid panel showed continued elevated LDL (or bad cholesterol) at 131. Recommend initiating Zetia 10 mg daily in addition to atorvastatin 80 mg daily to help lower LDL with goal of less than 70.  He was willing to try [facility] in [place], 90 day supply.  He wanted to let you know that Bp was elevated at pcp will be going in 3 x week to monitor for the next 2 wks.  Is walking 30 minutes daily.

## 2020-02-27 ENCOUNTER — Ambulatory Visit: Payer: Self-pay

## 2020-02-27 VITALS — BP 142/100 | HR 80

## 2020-02-27 DIAGNOSIS — Z0130 Encounter for examination of blood pressure without abnormal findings: Secondary | ICD-10-CM

## 2020-02-28 ENCOUNTER — Ambulatory Visit: Payer: Self-pay

## 2020-02-28 ENCOUNTER — Other Ambulatory Visit: Payer: Self-pay

## 2020-02-28 VITALS — BP 150/100

## 2020-02-28 DIAGNOSIS — Z0130 Encounter for examination of blood pressure without abnormal findings: Secondary | ICD-10-CM

## 2020-03-02 ENCOUNTER — Ambulatory Visit: Payer: Self-pay

## 2020-03-02 ENCOUNTER — Other Ambulatory Visit: Payer: Self-pay

## 2020-03-02 VITALS — BP 147/106

## 2020-03-02 DIAGNOSIS — Z0130 Encounter for examination of blood pressure without abnormal findings: Secondary | ICD-10-CM

## 2020-03-03 ENCOUNTER — Ambulatory Visit: Payer: Self-pay

## 2020-03-03 VITALS — BP 136/93

## 2020-03-03 DIAGNOSIS — Z0130 Encounter for examination of blood pressure without abnormal findings: Secondary | ICD-10-CM

## 2020-03-03 NOTE — Progress Notes (Signed)
Pt took bp meds 6am instead of at bed time. Pt will take again in the am and come in next couple of days to see if this maintains his bp better then taking it at night.

## 2020-03-11 ENCOUNTER — Other Ambulatory Visit: Payer: Self-pay

## 2020-03-11 ENCOUNTER — Ambulatory Visit: Payer: Self-pay

## 2020-03-11 VITALS — BP 132/88 | HR 90 | Temp 96.8°F | Resp 16 | Ht 68.0 in | Wt 200.0 lb

## 2020-03-11 DIAGNOSIS — Z0130 Encounter for examination of blood pressure without abnormal findings: Secondary | ICD-10-CM

## 2020-03-23 ENCOUNTER — Other Ambulatory Visit: Payer: Self-pay | Admitting: *Deleted

## 2020-03-23 NOTE — Patient Outreach (Signed)
[facility]) Care Management  03/23/2020  Wanya Bangura 03-Oct-1980 [id number]  [employer] referral, initial outreach unsuccesssful, left a message and requested a return call.  Zara Council. Burgess Estelle, MSN, [facility] Gerontological Nurse Practitioner [facility] Care Management [phone]

## 2020-03-25 ENCOUNTER — Other Ambulatory Visit: Payer: Self-pay | Admitting: *Deleted

## 2020-03-25 NOTE — Patient Outreach (Signed)
[facility]) Care Management  03/25/2020  Henry Powell 09/13/1980 [id number]  Second telephone outreach - [employer] referral for HTN Initiative.  Talked to Henry Powell briefly. He was at work and could not talk. I will call him after hours this evening.  Chart review reveals pt has a hx of HTN and CVA (R VA dissection Dec 2020). Pt is only 40 years old.  Unable to talk with Henry Powell. Will send unsuccessful outreach letter.  Zara Council. Burgess Estelle, MSN, [facility] Gerontological Nurse Practitioner [facility] Care Management [phone]

## 2020-03-27 ENCOUNTER — Encounter: Payer: Self-pay | Admitting: *Deleted

## 2020-04-06 ENCOUNTER — Encounter: Payer: Self-pay | Admitting: Emergency Medicine

## 2020-04-06 ENCOUNTER — Other Ambulatory Visit: Payer: Self-pay

## 2020-04-06 ENCOUNTER — Ambulatory Visit: Payer: Self-pay | Admitting: Emergency Medicine

## 2020-04-06 VITALS — BP 138/91 | Resp 14 | Ht 68.0 in | Wt 200.0 lb

## 2020-04-06 DIAGNOSIS — H6981 Other specified disorders of Eustachian tube, right ear: Secondary | ICD-10-CM

## 2020-04-06 MED ORDER — PREDNISONE 20 MG PO TABS: ORAL_TABLET | ORAL | 0 refills | Status: DC

## 2020-04-06 MED ORDER — FLUTICASONE PROPIONATE 50 MCG/ACT NA SUSP: 2.0000 | Freq: Every day | NASAL | 0 refills | Status: DC

## 2020-04-06 NOTE — Progress Notes (Signed)
Pt stated ear pain started Saturday.

## 2020-04-06 NOTE — Progress Notes (Signed)
I have reviewed the triage vital signs and the nursing notes.   HISTORY  Chief Complaint Ear Pain  HPI Henry Powell is a 39 y.o. male is here with complaint of right ear pain.  Patient denies any fever, chills, nausea or vomiting.  No URI symptoms.  He denies any drainage from his ear and also has not been swimming recently.       Past Medical History:  Diagnosis Date  . Hypertension   . Stroke [facility])     Patient Active Problem List   Diagnosis Date Noted  . 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 Cerebral edema (HCC) 08/26/2019  . Hyperlipidemia 08/26/2019  . B12 deficiency 08/26/2019  . Family hx-stroke 08/26/2019  . 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 Cerebellar stroke, acute (HCC)   . Hypertensive urgency   . Alcohol abuse     Past Surgical History:  Procedure Laterality Date  . NO PAST SURGERIES      Prior to Admission medications   Medication Sig Start Date End Date Taking? Authorizing Provider  amLODipine (NORVASC) 5 MG tablet Take 1 tablet (5 mg total) by mouth daily. 11/14/19   Ihor Austin, NP  aspirin 81 MG chewable tablet Chew by mouth daily.    [provider]  atorvastatin (LIPITOR) 80 MG tablet Take 1 tablet (80 mg total) by mouth daily at 6 PM. 11/14/19   Ihor Austin, NP  cyanocobalamin 1000 MCG tablet Take 1 tablet (1,000 mcg total) by mouth daily. 11/14/19   Ihor Austin, NP  escitalopram (LEXAPRO) 20 MG tablet Take 1 tablet (20 mg total) by mouth at bedtime. 11/28/19   Ihor Austin, NP  ezetimibe (ZETIA) 10 MG tablet Take 1 tablet (10 mg total) by mouth daily. 02/26/20   Ihor Austin, NP  fluticasone (FLONASE) 50 MCG/ACT nasal spray Place 2 sprays into both nostrils daily. 04/06/20   Tommi Rumps, PA-C  predniSONE (DELTASONE) 20 MG tablet 2 tablets once a day for 4 days 04/06/20   Tommi Rumps, PA-C    Allergies Patient has no known allergies.  Family History  Problem  Relation Age of Onset  . Hypertension Mother   . Diabetes Mother   . Hypertension Father   . Stroke Paternal Uncle   . Stroke Paternal Uncle     Social History Social History   Tobacco Use  . Smoking status: Never Smoker  . Smokeless tobacco: Former Neurosurgeon    Types: Chew  Substance Use Topics  . Alcohol use: Yes    Alcohol/week: 56.0 standard drinks    Types: 56 Cans of beer per week  . Drug use: Never    Review of Systems Constitutional: No fever/chills Eyes: No visual changes. ENT: Positive right ear pain. Cardiovascular: Denies chest pain. Respiratory: Denies shortness of breath.  Negative for cough. Gastrointestinal:  No nausea, no vomiting.  Skin: Negative for rash. Neurological: Negative for headaches ____________________________________________   PHYSICAL EXAM:  Constitutional: Alert and oriented. Well appearing and in no acute distress. Eyes: Conjunctivae are normal. PERRL. EOMI. Head: Atraumatic. Nose: No congestion/rhinnorhea.  Left EAC and TM clear.  Right EAC is clear.  TM is dull with poor light reflex.  No erythema or injection is noted.  Patient is unable to get his ear to pop. Mouth/Throat: Mucous membranes are moist.  Oropharynx non-erythematous. Neck: No stridor.  Musculoskeletal: Moves upper and lower extremities without difficulty.  Normal gait was noted. Neurologic:  Normal speech and language. No gross focal neurologic deficits are appreciated.  No Skin:  Skin is warm, dry and intact. Psychiatric: Mood and affect are normal. Speech and behavior are normal.  ____________________________________________   LABS (all labs ordered are listed, but only abnormal results are displayed)   PROCEDURES  Procedure(s) performed (including Critical Care):  Procedures   FINAL CLINICAL IMPRESSION(S)  39 year old male presents with right ear pain without URI symptoms.  Exam is consistent with eustachian tube dysfunction.  Patient was placed on  prednisone 40 mg for the next 4 days and also Flonase nasal spray.   ED Discharge Orders         Ordered    predniSONE (DELTASONE) 20 MG tablet     Discontinue  Reprint     04/06/20 1337    fluticasone (FLONASE) 50 MCG/ACT nasal spray  Daily     Discontinue  Reprint     04/06/20 1337           Note:  This document was prepared using Dragon voice recognition software and may include unintentional dictation errors.

## 2020-04-13 ENCOUNTER — Encounter: Payer: Self-pay | Admitting: Emergency Medicine

## 2020-04-13 ENCOUNTER — Ambulatory Visit: Payer: Self-pay | Admitting: Emergency Medicine

## 2020-04-13 ENCOUNTER — Other Ambulatory Visit: Payer: Self-pay

## 2020-04-13 VITALS — BP 142/96 | HR 99 | Temp 98.1°F | Resp 14 | Ht 68.0 in | Wt 200.0 lb

## 2020-04-13 DIAGNOSIS — H671 Otitis media in diseases classified elsewhere, right ear: Secondary | ICD-10-CM

## 2020-04-13 MED ORDER — PREDNISONE 10 MG (21) PO TBPK: ORAL_TABLET | ORAL | 0 refills | Status: DC

## 2020-04-13 NOTE — Progress Notes (Signed)
Occupational Health Provider Note       Time seen: 1:36 PM    I have reviewed the vital signs and the nursing notes.  HISTORY   Chief Complaint Ear Pain   HPI Henry Powell is a 39 y.o. male with a history of hypertension, CVA who presents today for right ear pain that has persisted.  Patient states is gotten better since has been on oral prednisone but he still has some right ear pain.  He denies any drainage, denies fevers or chills.  Past Medical History:  Diagnosis Date  . Hypertension   . Stroke [facility])     Past Surgical History:  Procedure Laterality Date  . NO PAST SURGERIES      Allergies Patient has no known allergies.   Review of Systems Constitutional: Negative for fever. HEENT exam: Positive for right ear pressure Musculoskeletal: Negative for back pain. Skin: Negative for rash. Neurological: Negative for headaches, focal weakness or numbness.  All systems negative/normal/unremarkable except as stated in the HPI  ____________________________________________   PHYSICAL EXAM:  VITAL SIGNS: Vitals:   04/13/20 1322  BP: (!) 142/96  Pulse: 99  Resp: 14  Temp: 98.1 F (36.7 C)  SpO2: 96%    Constitutional: Alert and oriented. Well appearing and in no distress. Eyes: Conjunctivae are normal. Normal extraocular movements. ENT      Head: Normocephalic and atraumatic.      Ears: Right TM and ear canal is normal      Nose: No congestion/rhinnorhea.      Mouth/Throat: Mucous membranes are moist.      Neck: No stridor. Musculoskeletal: Nontender with normal range of motion in extremities. No lower extremity tenderness nor edema. Neurologic:  Normal speech and language. No gross focal neurologic deficits are appreciated.  Skin:  Skin is warm, dry and intact. No rash noted.  ____________________________________________   LABS (pertinent positives/negatives)  Recent Results (from the past 2160 hour(s))  CMP12+LP+TP+TSH+6AC+CBC/D/Plt     Status:  Abnormal   Collection Time: 02/19/20  8:11 AM  Result Value Ref Range   Glucose 111 (H) 65 - 99 mg/dL   Uric Acid 5.8 3.8 - 8.4 mg/dL    Comment:            Therapeutic target for gout patients: <6.0   BUN 17 6 - 20 mg/dL   Creatinine, Ser 1.12 0.76 - 1.27 mg/dL   GFR calc non Af Amer 83 >59 mL/min/1.73   GFR calc Af Amer 96 >59 mL/min/1.73    Comment: **[facility] currently reports eGFR in compliance with the current**   recommendations of the [employer]. [facility] will   update reporting as new guidelines are published from the NKF-ASN   Task force.    BUN/Creatinine Ratio 15 9 - 20   Sodium 139 134 - 144 mmol/L   Potassium 4.5 3.5 - 5.2 mmol/L   Chloride 96 96 - 106 mmol/L   Calcium 9.6 8.7 - 10.2 mg/dL   Phosphorus 3.2 2.8 - 4.1 mg/dL   Total Protein 7.5 6.0 - 8.5 g/dL   Albumin 5.2 (H) 4.0 - 5.0 g/dL   Globulin, Total 2.3 1.5 - 4.5 g/dL   Albumin/Globulin Ratio 2.3 (H) 1.2 - 2.2   Bilirubin Total 0.6 0.0 - 1.2 mg/dL   Alkaline Phosphatase 90 48 - 121 IU/L   LDH 186 121 - 224 IU/L   AST 31 0 - 40 IU/L   ALT 59 (H) 0 - 44 IU/L   GGT 50  0 - 65 IU/L   Iron 114 38 - 169 ug/dL   Cholesterol, Total 205 (H) 100 - 199 mg/dL   Triglycerides 119 0 - 149 mg/dL   HDL 50 >39 mg/dL   VLDL Cholesterol Cal 21 5 - 40 mg/dL   LDL Chol Calc (NIH) 134 (H) 0 - 99 mg/dL   Chol/HDL Ratio 4.1 0.0 - 5.0 ratio    Comment:                                   T. Chol/HDL Ratio                                             Men  Women                               1/2 Avg.Risk  3.4    3.3                                   Avg.Risk  5.0    4.4                                2X Avg.Risk  9.6    7.1                                3X Avg.Risk 23.4   11.0    Estimated CHD Risk 0.8 0.0 - 1.0 times avg.    Comment: The CHD Risk is based on the T. Chol/HDL ratio. Other factors affect CHD Risk such as hypertension, smoking, diabetes, severe obesity, and family history of premature CHD.    TSH  2.380 0.450 - 4.500 uIU/mL   T4, Total 6.9 4.5 - 12.0 ug/dL   T3 Uptake Ratio 22 (L) 24 - 39 %   Free Thyroxine Index 1.5 1.2 - 4.9   WBC 6.3 3.4 - 10.8 x10E3/uL   RBC 4.84 4.14 - 5.80 x10E6/uL   Hemoglobin 14.7 13.0 - 17.7 g/dL   Hematocrit 42.2 37.5 - 51.0 %   MCV 87 79 - 97 fL   MCH 30.4 26.6 - 33.0 pg   MCHC 34.8 31 - 35 g/dL   RDW 14.1 11.6 - 15.4 %   Platelets 283 150 - 450 x10E3/uL   Neutrophils 54 Not Estab. %   Lymphs 33 Not Estab. %   Monocytes 10 Not Estab. %   Eos 2 Not Estab. %   Basos 1 Not Estab. %   Neutrophils Absolute 3.4 1 - 7 x10E3/uL   Lymphocytes Absolute 2.1 0 - 3 x10E3/uL   Monocytes Absolute 0.6 0 - 0 x10E3/uL   EOS (ABSOLUTE) 0.1 0.0 - 0.4 x10E3/uL   Basophils Absolute 0.1 0 - 0 x10E3/uL   Immature Granulocytes 0 Not Estab. %   Immature Grans (Abs) 0.0 0.0 - 0.1 x10E3/uL  POCT urinalysis dipstick     Status: Normal   Collection Time: 02/19/20  8:47 AM  Result Value Ref Range   Color, UA Pale Yellow    Clarity, UA Clear  Glucose, UA Negative Negative   Bilirubin, UA neg    Ketones, UA neg    Spec Grav, UA 1.015 1.010 - 1.025   Blood, UA neg    pH, UA 7.0 5.0 - 8.0   Protein, UA Negative Negative   Urobilinogen, UA 0.2 0.2 or 1.0 E.U./dL   Nitrite, UA neg    Leukocytes, UA Negative Negative   Appearance Clear    Odor    Lipid Panel     Status: Abnormal   Collection Time: 02/20/20  8:19 AM  Result Value Ref Range   Cholesterol, Total 195 100 - 199 mg/dL   Triglycerides 107 0 - 149 mg/dL   HDL 45 >39 mg/dL   VLDL Cholesterol Cal 19 5 - 40 mg/dL   LDL Chol Calc (NIH) 131 (H) 0 - 99 mg/dL   Chol/HDL Ratio 4.3 0.0 - 5.0 ratio    Comment:                                   T. Chol/HDL Ratio                                             Men  Women                               1/2 Avg.Risk  3.4    3.3                                   Avg.Risk  5.0    4.4                                2X Avg.Risk  9.6    7.1                                3X  Avg.Risk 23.4   11.0   Vitamin B12     Status: None   Collection Time: 02/20/20  8:19 AM  Result Value Ref Range   Vitamin B-12 975 232 - 1,245 pg/mL    ASSESSMENT AND PLAN  Otitis   Plan: The patient had presented for right ear pain.  Examination is unremarkable, we will try a longer course of steroids.  Otherwise he is cleared for outpatient follow-up.  Lenise Arena MD    Note: This note was generated in part or whole with voice recognition software. Voice recognition is usually quite accurate but there are transcription errors that can and very often do occur. I apologize for any typographical errors that were not detected and corrected.

## 2020-04-16 ENCOUNTER — Other Ambulatory Visit: Payer: Self-pay | Admitting: *Deleted

## 2020-04-16 NOTE — Patient Outreach (Signed)
[facility]) Care Management  04/16/2020  Terin Cragle 01-27-1981 [id number]  Third attempt to engage patient for [employer]ET[employer] HTN INITIATIVE. Left a message and requested a return call. Will close this referral at this time if no call back. Will also outreach to [place] [employer] [place], [place]) to see if they will outreach pt and encourage participation. [employer]ET[employer] will supply with BP cuff, if he does not have one.  Zara Council. Burgess Estelle, MSN, [facility] Gerontological Nurse Practitioner [facility] Care Management [phone]

## 2020-04-21 ENCOUNTER — Encounter: Payer: Self-pay | Admitting: *Deleted

## 2020-04-21 ENCOUNTER — Other Ambulatory Visit: Payer: Self-pay | Admitting: *Deleted

## 2020-04-21 NOTE — Patient Outreach (Signed)
[facility]) Care Management  04/21/2020  Kylie Gros 03-25-1981 [id number]  4th outreach to engage for [employer]ET[employer] HTN INITIATIVE.  Talked with Mr. Dubree today. He listened to my introduction to [facility] services with regards to the AET[employer] HTN INITIATIVE. He did not give permission for the program at this time. His father is hospitalized and on a ventilator and he is overwhelmed at this time. Listened and offered support. Encouraged him to participate in the furture. 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 He can get an appt when necessary. He does have a home monitor but does not use it often. We talked about prevention is the key to longer and better quality of life. He said he would consider and asked that I call him back in 2 weeks.  Will send pt a successful letter.  Zara Council. Burgess Estelle, MSN, [facility] Gerontological Nurse Practitioner [facility] Care Management [phone]

## 2020-04-28 ENCOUNTER — Telehealth: Payer: Self-pay | Admitting: Adult Health

## 2020-04-28 ENCOUNTER — Encounter: Payer: Self-pay | Admitting: *Deleted

## 2020-04-28 NOTE — Telephone Encounter (Signed)
This will depend on severity of dizziness episodes but if mild and does not interfere with functioning, he may return back to full duty but would recommend supervision with heavy machinery use at least over the next couple weeks

## 2020-04-28 NOTE — Telephone Encounter (Signed)
Spoke to pt.  He had last letter 11/2019 for light duty.  He states that he still has dizzy 2 sec spells maybe once week.  Has been working just not using heavy machinery.  Forgot to ask last time he was in.  Please advise.

## 2020-04-28 NOTE — Telephone Encounter (Deleted)
Has appt today with Shanda Bumps.

## 2020-04-28 NOTE — Telephone Encounter (Signed)
LMVM for pt JM/NP message.  If he is ok with this, can put together letter relating this.

## 2020-04-28 NOTE — Telephone Encounter (Signed)
Pt called wanting to speak to provider about the letter he was given to be on light duty. Pt would like to discuss if he is to continue and if so he is needing a new letter for work. Please advise.

## 2020-04-29 NOTE — Telephone Encounter (Signed)
LMVm for pt to return call.  

## 2020-04-29 NOTE — Telephone Encounter (Signed)
Spoke to pt and placed 3 wks time frame for the supervision.  He will monitor dizziness and then will let us know if concerns.  Letter to him via email [email or web address].

## 2020-05-11 ENCOUNTER — Encounter: Payer: Self-pay | Admitting: *Deleted

## 2020-05-11 ENCOUNTER — Other Ambulatory Visit: Payer: Self-pay | Admitting: *Deleted

## 2020-05-11 NOTE — Patient Outreach (Signed)
[facility]) Care Management  05/11/2020  Georges Victorio Dec 06, 1980 [id number]  Telephone outreach. Pt unable to talk, he is at work.  Called at 5:00 pm, no answer, left a message. Reminded him why I am calling, to invite him to participate in the AET[employer] HTN Initiative.  Advised if I did not hear back from him after this call, that I will close the referral.   Noralyn Pick C. Burgess Estelle, MSN, [facility] Gerontological Nurse Practitioner [facility] Care Management [phone]

## 2020-05-15 NOTE — Patient Outreach (Signed)
[facility]) Care Management  05/15/2020  Eleno Weimar [birth year] [id number]  Did not hear back from Mr. Febles. Closing referral.  Zara Council. Burgess Estelle, MSN, [facility] Gerontological Nurse Practitioner [facility] Care Management [phone]

## 2020-05-20 ENCOUNTER — Other Ambulatory Visit: Payer: Self-pay

## 2020-06-02 ENCOUNTER — Other Ambulatory Visit: Payer: Self-pay

## 2020-06-02 ENCOUNTER — Ambulatory Visit: Payer: Self-pay | Admitting: Nurse Practitioner

## 2020-06-02 VITALS — BP 146/110 | HR 98 | Temp 98.5°F | Resp 18 | Ht 68.0 in | Wt 225.0 lb

## 2020-06-02 DIAGNOSIS — Z7689 Persons encountering health services in other specified circumstances: Secondary | ICD-10-CM

## 2020-06-02 DIAGNOSIS — I10 Essential (primary) hypertension: Secondary | ICD-10-CM

## 2020-06-02 DIAGNOSIS — L02412 Cutaneous abscess of left axilla: Secondary | ICD-10-CM

## 2020-06-02 MED ORDER — DOXYCYCLINE HYCLATE 100 MG PO TABS: 100.0000 mg | ORAL_TABLET | Freq: Two times a day (BID) | ORAL | 0 refills | Status: DC

## 2020-06-02 NOTE — Progress Notes (Signed)
Pt states lump under left arm pit has been there about a month. Pt previously tried to pop it and it came back. Cl,rma

## 2020-06-02 NOTE — Progress Notes (Signed)
  Subjective:     Patient ID: Henry Powell, male   DOB: May 16, 1981, 39 y.o.   MRN: [id number]  HPI Henry Powell is a 39 y.o. male who presents to the [facility] with c/o swelling and pain of the left axilla. Employee reports that he has been having a problem off and on for the past month. At one point with mashed on the area and was able to drain purulent d/c from the area. Now it is getting larger than before.   Employee also with elevated BP. Hx of CVA and started on BP medication by his neurologist. Employee has had one previous visit here and BP was elevated. He was told to take his BP medication in the mornings and keep a list of readings for a week. Employee did and it improved. Today the reading here is 146/110. Employee to call neurologist today and discussed elevated readings and f/u plan.   Review of Systems  Constitutional:       Obesity  Skin:       Knot left axilla  All other systems reviewed and are negative.      Objective:BP (!) 146/110   Pulse 98   Temp 98.5 F (36.9 C)   Resp 18   Ht 5\' 8"  (1.727 m)   Wt 225 lb (102.1 kg)   SpO2 98%   BMI 34.21 kg/m     Physical Exam Vitals and nursing note reviewed.  Constitutional:      General: He is not in acute distress.    Appearance: He is obese.  HENT:     Head: Normocephalic.  Cardiovascular:     Rate and Rhythm: Normal rate.  Musculoskeletal:        General: Normal range of motion.  Skin:    Findings: Abscess present.     Comments: Raised, tender, firm area to left axilla with mild erythema.  Neurological:     Mental Status: He is alert.  Psychiatric:        Mood and Affect: Mood normal.      PROCEDURE Incision and drainage:  Verbal Consent for I&D Patient identified  Discussed procedure with the patient  Draped in sterile fashion  Cleaned with Betadine Anesthesia: Lidocaine 1% with epi 3 ccs Incision: #11 blade, single straight Drainage: Small amount of purulent bloody drainage  Explored with  hemostat to break up loculations Irrigated with NSS Packing: none Covered with dressing.  Patient tolerated the procedure without any immediate complications    Assessment:    Abscess left axilla Elevated BP    Plan:    I&D of abscess, discussed warm wet compresses and care s/p I&D. Discussed signs/symptoms of need for return.  Discussed elevated BP and need for f/u with neurologist who started the BP medication s/p CVA. Employee given opportunity to ask questions. All questions answered and employee voices understanding. F/u instructions given verbally and in writing.

## 2020-06-02 NOTE — Patient Instructions (Signed)
Skin Abscess  A skin abscess is an infected area of your skin that contains pus and other material. An abscess can happen in any part of your body. Some abscesses break open (rupture) on their own. Most continue to get worse unless they are treated. The infection can spread deeper into the body and into your blood, which can make you feel sick. A skin abscess is caused by germs that enter the skin through a cut or scrape. It can also be caused by blocked oil and sweat glands or infected hair follicles. This condition is usually treated by:  Draining the pus.  Taking antibiotic medicines.  Placing a warm, wet washcloth over the abscess. Follow these instructions at home: Medicines   Take over-the-counter and prescription medicines only as told by your doctor.  If you were prescribed an antibiotic medicine, take it as told by your doctor. Do not stop taking the antibiotic even if you start to feel better. Abscess care   If you have an abscess that has not drained, place a warm, clean, wet washcloth over the abscess several times a day. Do this as told by your doctor.  Follow instructions from your doctor about how to take care of your abscess. Make sure you: ? Cover the abscess with a bandage (dressing). ? Change your bandage or gauze as told by your doctor. ? Wash your hands with soap and water before you change the bandage or gauze. If you cannot use soap and water, use hand sanitizer.  Check your abscess every day for signs that the infection is getting worse. Check for: ? More redness, swelling, or pain. ? More fluid or blood. ? Warmth. ? More pus or a bad smell. General instructions  To avoid spreading the infection: ? Do not share personal care items, towels, or hot tubs with others. ? Avoid making skin-to-skin contact with other people.  Keep all follow-up visits as told by your doctor. This is important. Contact a doctor if:  You have more redness, swelling, or pain  around your abscess.  You have more fluid or blood coming from your abscess.  Your abscess feels warm when you touch it.  You have more pus or a bad smell coming from your abscess.  You have a fever.  Your muscles ache.  You have chills.  You feel sick. Get help right away if:  You have very bad (severe) pain.  You see red streaks on your skin spreading away from the abscess. Summary  A skin abscess is an infected area of your skin that contains pus and other material.  The abscess is caused by germs that enter the skin through a cut or scrape. It can also be caused by blocked oil and sweat glands or infected hair follicles.  Follow your doctor's instructions on caring for your abscess, taking medicines, preventing infections, and keeping follow-up visits. This information is not intended to replace advice given to you by your health care provider. Make sure you discuss any questions you have with your health care provider. Document Revised: 04/04/2019 Document Reviewed: 10/12/2017 Elsevier Patient Education  2020 Elsevier Inc.  [employer] Therapy Heat therapy can help ease sore, stiff, injured, and tight muscles and joints. Heat relaxes your muscles, which may help ease your pain and muscle spasms. Do not use heat therapy unless your doctor tells you to use it. How to use heat therapy There are several different kinds of heat therapy, including:  Moist heat pack.  Hot water  bottle.  Electric heating pad.  Heated gel pack.  Heated wrap.  Warm water bath. Your doctor will tell you how to use heat therapy. In general, you should: 1. Place a towel between your skin and the heat source. 2. Leave the heat on for 20-30 minutes. Your skin may turn pink. 3. Remove the heat if your skin turns bright red. You should remove the heat source if you are unable to feel pain, heat, or cold. You are more likely to get burned if you leave it on the skin for too long. Your doctor may also  tell you to take a warm water bath. To do this: 1. Put a non-slip pad in the bathtub to prevent a fall. 2. Fill the bathtub with warm water. 3. Check the water temperature. 4. Soak in the water for 15-20 minutes, or as told by your doctor. 5. Be careful when you stand up after the bath. You may feel dizzy. 6. Pat yourself dry after the bath. Do not rub your skin to dry it. General recommendations for heat therapy  Do not sleep while using heat therapy. Only use heat therapy while you are awake.  Your skin may turn pink while using heat therapy. Do not use heat therapy if your skin turns red.  Do not use heat therapy if you have a new injury.  High heat or using heat for a long time can cause burns. Be careful not to burn your skin when using heat therapy.  Do not use heat therapy on areas of your skin that are already irritated, such as with a rash or sunburn. Get help if you have:  Blisters, redness, swelling (puffiness), or numbness.  New pain.  Pain that is getting worse. Summary  Heat therapy is the use of heat to help ease sore, stiff, injured, and tight muscles and joints.  There are different types of heat therapy. Your doctor will tell you which one to use.  Only use heat therapy while you are awake.  Watch your skin to make sure you do not get burned while using heat therapy. This information is not intended to replace advice given to you by your health care provider. Make sure you discuss any questions you have with your health care provider. Document Revised: 10/22/2018 Document Reviewed: 09/09/2017 Elsevier Patient Education  2020 [employer].

## 2020-06-04 ENCOUNTER — Other Ambulatory Visit: Payer: Self-pay

## 2020-06-04 ENCOUNTER — Encounter: Payer: Self-pay | Admitting: Physician Assistant

## 2020-06-04 ENCOUNTER — Ambulatory Visit: Payer: Self-pay | Admitting: Physician Assistant

## 2020-06-04 VITALS — BP 150/120 | HR 95 | Temp 98.1°F | Resp 16 | Ht 68.0 in | Wt 220.0 lb

## 2020-06-04 DIAGNOSIS — Z5189 Encounter for other specified aftercare: Secondary | ICD-10-CM

## 2020-06-04 NOTE — Progress Notes (Signed)
   Subjective: Wound check    Patient ID: Henry Powell, male    DOB: 1981-08-08, 39 y.o.   MRN: [id number]  HPI Patient presents today for wound check.  Patient had nodular lesion incised and drained 3 days ago.  Patient is taking antibiotics voices no complaints.  Patient also voiced concerns about his blood pressure readings.  Patient states every time he comes to the clinic his blood pressure is high but when he checked it at home it is normal patient is currently taking Norvasc 5 mg.  Patient denies headache, vision disturbance, or vertigo.   Review of Systems    Hypertension Objective:   Physical Exam No acute distress.  BP is 150/120.  Examination left axillary area reveals mild erythema status post incision site.       Assessment & Plan:Wound Check  Patient advised to continue taking antibiotics.  Advised 3-day blood pressure check at this facility.  Patient advised to bring his home blood pressure machine for comparison.

## 2020-06-04 NOTE — Progress Notes (Signed)
Pt presents today to follow for I&D on lump under armpit performed by [facility] NP. Gretel Acre

## 2020-06-05 ENCOUNTER — Ambulatory Visit: Payer: Self-pay

## 2020-06-05 DIAGNOSIS — I10 Essential (primary) hypertension: Secondary | ICD-10-CM

## 2020-06-05 NOTE — Progress Notes (Signed)
Pt presented today to compare bp cuffs due to hypertension per Durward Parcel PA-C. RIGHT ARM Pt owed BP cuff electric : 169/125 Clinic electric:153/113  Clinic Manual: 148/112  Recheck after >10 mins  RIGHT ARM LAB CHAIR RECLINED Pt owed BP cuff Electric: 162/117 Clinic electric: 152/111 Clinic Manual: 148/116

## 2020-06-08 ENCOUNTER — Ambulatory Visit: Payer: Self-pay

## 2020-06-08 ENCOUNTER — Other Ambulatory Visit: Payer: Self-pay

## 2020-06-08 VITALS — BP 150/110

## 2020-06-08 DIAGNOSIS — I10 Essential (primary) hypertension: Secondary | ICD-10-CM

## 2020-06-08 NOTE — Progress Notes (Signed)
Pt presented today to get bp rechecked. BP stands at 157/111 electric and manual 150/110. CL,RMA

## 2020-06-11 ENCOUNTER — Ambulatory Visit: Payer: Self-pay | Admitting: Physician Assistant

## 2020-06-11 ENCOUNTER — Other Ambulatory Visit: Payer: Self-pay

## 2020-06-11 ENCOUNTER — Encounter: Payer: Self-pay | Admitting: Physician Assistant

## 2020-06-11 VITALS — BP 134/90 | HR 91 | Temp 98.0°F | Resp 14 | Ht 68.0 in | Wt 220.0 lb

## 2020-06-11 DIAGNOSIS — F411 Generalized anxiety disorder: Secondary | ICD-10-CM

## 2020-06-11 DIAGNOSIS — I10 Essential (primary) hypertension: Secondary | ICD-10-CM

## 2020-06-11 HISTORY — DX: Generalized anxiety disorder: F41.1

## 2020-06-11 MED ORDER — CLONAZEPAM 0.5 MG PO TABS: 0.5000 mg | ORAL_TABLET | Freq: Every day | ORAL | 0 refills | Status: DC

## 2020-06-11 NOTE — Progress Notes (Signed)
   Subjective: Hypertension    Patient ID: Henry Powell, male    DOB: [birth year], 39 y.o.   MRN: [id number]  HPI Patient presents status post with a blood pressure check after increasing Norvasc from 5 mg to 10 mg.  Patient state he noticed decrease in his blood pressure.  Patient also states that he is to start taking the Lexapro that was prescribed for his anxiety because he did not like how they "make him feel".  Patient said he stopped the medication approximately 3 weeks ago.   Review of Systems    Anxiety, hyperlipidemia, and hypertension. Objective:   Physical Exam Appears in no acute distress.  BP is 134/90.  HEENT is unremarkable.  Neck is supple for adenopathy or bruits.  Lungs are clear to auscultation.  Heart regular rate and rhythm.       Assessment & Plan: Hypertension and anxiety.  Discussed with patient rationale for continued Norvasc at 10 mg daily.  Patient will stop Xanax 0.5 mg at bedtime and follow-up in 2 weeks to recheck blood pressure and anxiety.

## 2020-06-11 NOTE — Progress Notes (Signed)
PT presents today for follow up on BP with Ron Katrinka Blazing, PA-C. Gretel Acre

## 2020-06-26 ENCOUNTER — Ambulatory Visit: Payer: Self-pay | Admitting: Physician Assistant

## 2020-06-26 ENCOUNTER — Encounter: Payer: Self-pay | Admitting: Physician Assistant

## 2020-06-26 ENCOUNTER — Other Ambulatory Visit: Payer: Self-pay

## 2020-06-26 VITALS — BP 148/98 | HR 103 | Temp 98.8°F | Resp 14 | Ht 68.0 in | Wt 222.0 lb

## 2020-06-26 DIAGNOSIS — I10 Essential (primary) hypertension: Secondary | ICD-10-CM

## 2020-06-26 MED ORDER — ATORVASTATIN CALCIUM 80 MG PO TABS: 80.0000 mg | ORAL_TABLET | Freq: Every day | ORAL | 3 refills | Status: DC

## 2020-06-26 MED ORDER — AMLODIPINE BESYLATE 10 MG PO TABS: 10.0000 mg | ORAL_TABLET | Freq: Every day | ORAL | 3 refills | Status: DC

## 2020-06-26 NOTE — Progress Notes (Signed)
° °  Subjective: Hypertension    Patient ID: Henry Powell, male    DOB: [birth year], 39 y.o.   MRN: [id number]  HPI Patient presents for evaluation of hypertension secondary to increasing Norvasc from 5 mg to 10 mg.  Patient voices no concerns or complaints due to doses increase.   Review of Systems    Hyperlipidemia and hypertension. Objective:   Physical Exam No acute distress.  HEENT is unremarkable.  Neck is supple for adenopathy or bruits.  Lungs are clear to auscultation.  Heart regular rate and rhythm.  Blood pressure 140/92.      Assessment & Plan: Hypertension  Advised patient to continue Norvasc at 10 mg daily.  Prescription refill sent to pharmacy today.  Follow-up as scheduled physical exam.

## 2020-06-26 NOTE — Progress Notes (Signed)
Pt presents today for recheck on BP with Ron Smith,PA-C./CL,RMA

## 2020-07-06 ENCOUNTER — Ambulatory Visit: Payer: Self-pay | Admitting: Physician Assistant

## 2020-07-06 ENCOUNTER — Encounter: Payer: Self-pay | Admitting: Physician Assistant

## 2020-07-06 ENCOUNTER — Other Ambulatory Visit: Payer: Self-pay

## 2020-07-06 VITALS — BP 146/100 | HR 81 | Temp 98.3°F | Resp 16 | Ht 68.0 in | Wt 220.0 lb

## 2020-07-06 DIAGNOSIS — M5432 Sciatica, left side: Secondary | ICD-10-CM

## 2020-07-06 DIAGNOSIS — S39012A Strain of muscle, fascia and tendon of lower back, initial encounter: Secondary | ICD-10-CM

## 2020-07-06 MED ORDER — TRIAMCINOLONE ACETONIDE 40 MG/ML IJ SUSP
40.0000 mg | Freq: Once | INTRAMUSCULAR | Status: AC
  Administered 2020-07-06: 40 mg via INTRAMUSCULAR

## 2020-07-06 MED ORDER — ORPHENADRINE CITRATE ER 100 MG PO TB12: 100.0000 mg | ORAL_TABLET | Freq: Two times a day (BID) | ORAL | 0 refills | Status: DC

## 2020-07-06 MED ORDER — METHYLPREDNISOLONE 4 MG PO TBPK: ORAL_TABLET | ORAL | 0 refills | Status: DC

## 2020-07-06 MED ORDER — LIDOCAINE 5 % EX PTCH: 1.0000 | MEDICATED_PATCH | CUTANEOUS | 0 refills | Status: DC

## 2020-07-06 NOTE — Progress Notes (Signed)
   Subjective: Radicular back pain    Patient ID: Henry Powell, male    DOB: 02/18/1981, 39 y.o.   MRN: [id number]  HPI Patient presents with 1 week of increasing low back pain.  Patient states radicular component to the left lower extremity.  Onset of complaint after pulling incident 1 week ago.  Patient has bladder bowel dysfunction.  Patient rates pain as a 10/10.  Patient did pain increased with flexion.  Review of Systems    Anxiety, depression, hyperlipidemia, and hypertension. Objective:   Physical Exam  Patient appears in moderate distress.  Patient sits and stands with heavy reliance on the upper extremities.  No obvious deformity to the lumbar spine.  Patient has moderate guarding palpation of the left paraspinal muscle area.  Palpable muscle spasms.  Decreased range of motion with flexion.  Patient has a positive straight leg test in the sitting and supine position.      Assessment & Plan: Radicular back pain secondary to lumbar strain.  Patient given prescription for Norflex, Medrol Dosepak, and Lidoderm patches.  Patient was given IM prior to departure.  Patient given a work note to return back tomorrow for reevaluation.

## 2020-07-07 ENCOUNTER — Encounter: Payer: Self-pay | Admitting: Physician Assistant

## 2020-07-07 ENCOUNTER — Ambulatory Visit: Payer: Self-pay | Admitting: Physician Assistant

## 2020-07-07 VITALS — BP 147/110 | HR 114 | Temp 98.0°F | Resp 14 | Ht 68.0 in | Wt 217.0 lb

## 2020-07-07 DIAGNOSIS — S39012D Strain of muscle, fascia and tendon of lower back, subsequent encounter: Secondary | ICD-10-CM

## 2020-07-07 MED ORDER — TRAMADOL HCL 50 MG PO TABS
50.0000 mg | ORAL_TABLET | Freq: Four times a day (QID) | ORAL | 0 refills | Status: DC | PRN
Start: 2020-07-07 — End: 2020-10-15

## 2020-07-07 NOTE — Progress Notes (Signed)
   Subjective: Lumbar strain    Patient ID: Henry Powell, male    DOB: 1981-02-18, 38 y.o.   MRN: [id number]  HPI Patient follow-up for lumbar strain.  Patient the pain has decreased with muscle relaxers, steroids, and Lidoderm patch.  Patient states continues to have pain with flexion.  Pain eases remarkable when laying down.  Patient again denies radicular component to pain.  No bladder or bowel dysfunction.   Review of Systems    Negative septal complaint Objective:   Physical Exam  No obvious deformity of the lumbar spine.  Patient has decreased range of motion with flexion.  No palpable muscle spasm on today's exam.  Patient continues to have moderate guarding with palpation of left paraspinal muscle area.      Assessment & Plan: Resolving lumbar strain.  Continue previous medication.  Patient given prescription for 3 days of tramadol to take every 6 hours.  Follow-up in 2 days.  We will consider physical therapy if pain not completely resolved on next visit.

## 2020-07-09 ENCOUNTER — Encounter: Payer: Self-pay | Admitting: Nurse Practitioner

## 2020-07-09 ENCOUNTER — Other Ambulatory Visit: Payer: Self-pay

## 2020-07-09 ENCOUNTER — Ambulatory Visit: Payer: Self-pay | Admitting: Nurse Practitioner

## 2020-07-09 VITALS — BP 140/100 | HR 103 | Temp 98.0°F | Resp 14 | Ht 68.0 in | Wt 214.0 lb

## 2020-07-09 DIAGNOSIS — S39012D Strain of muscle, fascia and tendon of lower back, subsequent encounter: Secondary | ICD-10-CM

## 2020-07-09 NOTE — Progress Notes (Signed)
  Subjective:     Patient ID: Henry Powell, male   DOB: 1981-03-24, 39 y.o.   MRN: [id number]  HPI Henry Powell is a 39 y.o. male who presents to the [facility] today for f/u of back injury. 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 The injury occurred at work about 10/18. He was treated with Norflex, Medrol dosepak and Lidoderm patches. PT was discussed and the employee reports that he did go yesterday and had therapy and acupuncture that has helped to relieve the pain a little. He describes the pain as a burning sensation that starts in the left lower back and radiates to the left leg.    Review of Systems  Musculoskeletal: Positive for arthralgias and back pain.  All other systems reviewed and are negative.      Objective: BP (!) 140/100   Pulse (!) 103   Temp 98 F (36.7 C)   Resp 14   Ht 5\' 8"  (1.727 m)   Wt 214 lb (97.1 kg)   SpO2 97%   BMI 32.54 kg/m     Physical Exam Vitals and nursing note reviewed.  Cardiovascular:     Rate and Rhythm: Tachycardia present.  Pulmonary:     Effort: Pulmonary effort is normal.  Musculoskeletal:     Cervical back: Normal range of motion.     Lumbar back: Spasms present. Decreased range of motion: due to pain. Positive left straight leg raise test. Negative right straight leg raise test.       Back:     Comments: Tender with palpation left lower lumbar area. Pain with SLR on the left.   Skin:    General: Skin is warm and dry.  Neurological:     Mental Status: He is alert.  Psychiatric:        Mood and Affect: Mood normal.       Assessment/Plan    39 y.o. male here for f/u of muscle strain of the left lower back that radiates to the sciatic nerve. Employee to continue PT and medications and RTC 11/2 for f/u. Or sooner if needed.  Elevated BP, patient aware of elevated BP and will take his medication. Work note given until f/u visit.

## 2020-07-09 NOTE — Patient Instructions (Signed)
Lumbar Strain A lumbar strain, which is sometimes called a low-back strain, is a stretch or tear in a muscle or the strong cords of tissue that attach muscle to bone (tendons) in the lower back (lumbar spine). This type of injury occurs when muscles or tendons are torn or are stretched beyond their limits. Lumbar strains can range from mild to severe. Mild strains may involve stretching a muscle or tendon without tearing it. These may heal in 1-2 weeks. More severe strains involve tearing of muscle fibers or tendons. These will cause more pain and may take 6-8 weeks to heal. What are the causes? This condition may be caused by:  Trauma, such as a fall or a hit to the body.  Twisting or overstretching the back. This may result from doing activities that need a lot of energy, such as lifting heavy objects. What increases the risk? This injury is more common in:  Athletes.  People with obesity.  People who do repeated lifting, bending, or other movements that involve their back. What are the signs or symptoms? Symptoms of this condition may include:  Sharp or dull pain in the lower back that does not go away. The pain may extend to the buttocks.  Stiffness or limited range of motion.  Sudden muscle tightening (spasms). How is this diagnosed? This condition may be diagnosed based on:  Your symptoms.  Your medical history.  A physical exam.  Imaging tests, such as: ? X-rays. ? MRI. How is this treated? Treatment for this condition may include:  Rest.  Applying heat and cold to the affected area.  Over-the-counter medicines to help relieve pain and inflammation, such as NSAIDs.  Prescription pain medicine and muscle relaxants may be needed for a short time.  Physical therapy. Follow these instructions at home: Managing pain, stiffness, and swelling      If directed, put ice on the injured area during the first 24 hours after your injury. ? Put ice in a plastic  bag. ? Place a towel between your skin and the bag. ? Leave the ice on for 20 minutes, 2-3 times a day.  If directed, apply heat to the affected area as often as told by your health care provider. Use the heat source that your health care provider recommends, such as a moist heat pack or a heating pad. ? Place a towel between your skin and the heat source. ? Leave the heat on for 20-30 minutes. ? Remove the heat if your skin turns bright red. This is especially important if you are unable to feel pain, heat, or cold. You may have a greater risk of getting burned. Activity  Rest and return to your normal activities as told by your health care provider. Ask your health care provider what activities are safe for you.  Do exercises as told by your health care provider. Medicines  Take over-the-counter and prescription medicines only as told by your health care provider.  Ask your health care provider if the medicine prescribed to you: ? Requires you to avoid driving or using heavy machinery. ? Can cause constipation. You may need to take these actions to prevent or treat constipation:  Drink enough fluid to keep your urine pale yellow.  Take over-the-counter or prescription medicines.  Eat foods that are high in fiber, such as beans, whole grains, and fresh fruits and vegetables.  Limit foods that are high in fat and processed sugars, such as fried or sweet foods. Injury prevention To prevent   a future low-back injury:  Always warm up properly before physical activity or sports.  Cool down and stretch after being active.  Use correct form when playing sports and lifting heavy objects. Bend your knees before you lift heavy objects.  Use good posture when sitting and standing.  Stay physically fit and keep a healthy weight. ? Do at least 150 minutes of moderate-intensity exercise each week, such as brisk walking or water aerobics. ? Do strength exercises at least 2 times each  week.  General instructions  Do not use any products that contain nicotine or tobacco, such as cigarettes, e-cigarettes, and chewing tobacco. If you need help quitting, ask your health care provider.  Keep all follow-up visits as told by your health care provider. This is important. Contact a health care provider if:  Your back pain does not improve after 6 weeks of treatment.  Your symptoms get worse. Get help right away if:  Your back pain is severe.  You are unable to stand or walk.  You develop pain in your legs.  You develop weakness in your buttocks or legs.  You have difficulty controlling when you urinate or when you have a bowel movement. ? You have frequent, painful, or bloody urination. ? You have a temperature over 101.75F (38.3C) Summary  A lumbar strain, which is sometimes called a low-back strain, is a stretch or tear in a muscle or the strong cords of tissue that attach muscle to bone (tendons) in the lower back (lumbar spine).  This type of injury occurs when muscles or tendons are torn or are stretched beyond their limits.  Rest and return to your normal activities as told by your health care provider. If directed, apply heat and ice to the affected area as often as told by your health care provider.  Take over-the-counter and prescription medicines only as told by your health care provider.  Contact a health care provider if you have new or worsening symptoms. This information is not intended to replace advice given to you by your health care provider. Make sure you discuss any questions you have with your health care provider. Document Revised: 06/28/2018 Document Reviewed: 06/28/2018 Elsevier Patient Education  2020 [employer]  Sciatica  Sciatica is pain, numbness, weakness, or tingling along the path of the sciatic nerve. The sciatic nerve starts in the lower back and runs down the back of each leg. The nerve controls the muscles in the lower leg  and in the back of the knee. It also provides feeling (sensation) to the back of the thigh, the lower leg, and the sole of the foot. Sciatica is a symptom of another medical condition that pinches or puts pressure on the sciatic nerve. Sciatica most often only affects one side of the body. Sciatica usually goes away on its own or with treatment. In some cases, sciatica may come back (recur). What are the causes? This condition is caused by pressure on the sciatic nerve or pinching of the nerve. This may be the result of:  A disk in between the bones of the spine bulging out too far (herniated disk).  Age-related changes in the spinal disks.  A pain disorder that affects a muscle in the buttock.  Extra bone growth near the sciatic nerve.  A break (fracture) of the pelvis.  Pregnancy.  Tumor. This is rare. What increases the risk? The following factors may make you more likely to develop this condition:  Playing sports that place pressure or  stress on the spine.  Having poor strength and flexibility.  A history of back injury or surgery.  Sitting for long periods of time.  Doing activities that involve repetitive bending or lifting.  Obesity. What are the signs or symptoms? Symptoms can vary from mild to very severe, and they may include:  Any of these problems in the lower back, leg, hip, or buttock: ? Mild tingling, numbness, or dull aches. ? Burning sensations. ? Sharp pains.  Numbness in the back of the calf or the sole of the foot.  Leg weakness.  Severe back pain that makes movement difficult. Symptoms may get worse when you cough, sneeze, or laugh, or when you sit or stand for long periods of time. How is this diagnosed? This condition may be diagnosed based on:  Your symptoms and medical history.  A physical exam.  Blood tests.  Imaging tests, such as: ? X-rays. ? MRI. ? CT scan. How is this treated? In many cases, this condition improves on its own  without treatment. However, treatment may include:  Reducing or modifying physical activity.  Exercising and stretching.  Icing and applying heat to the affected area.  Medicines that help to: ? Relieve pain and swelling. ? Relax your muscles.  Injections of medicines that help to relieve pain, irritation, and inflammation around the sciatic nerve (steroids).  Surgery. Follow these instructions at home: Medicines  Take over-the-counter and prescription medicines only as told by your health care provider.  Ask your health care provider if the medicine prescribed to you: ? Requires you to avoid driving or using heavy machinery. ? Can cause constipation. You may need to take these actions to prevent or treat constipation:  Drink enough fluid to keep your urine pale yellow.  Take over-the-counter or prescription medicines.  Eat foods that are high in fiber, such as beans, whole grains, and fresh fruits and vegetables.  Limit foods that are high in fat and processed sugars, such as fried or sweet foods. Managing pain      If directed, put ice on the affected area. ? Put ice in a plastic bag. ? Place a towel between your skin and the bag. ? Leave the ice on for 20 minutes, 2-3 times a day.  If directed, apply heat to the affected area. Use the heat source that your health care provider recommends, such as a moist heat pack or a heating pad. ? Place a towel between your skin and the heat source. ? Leave the heat on for 20-30 minutes. ? Remove the heat if your skin turns bright red. This is especially important if you are unable to feel pain, heat, or cold. You may have a greater risk of getting burned. Activity   Return to your normal activities as told by your health care provider. Ask your health care provider what activities are safe for you.  Avoid activities that make your symptoms worse.  Take brief periods of rest throughout the day. ? When you rest for longer  periods, mix in some mild activity or stretching between periods of rest. This will help to prevent stiffness and pain. ? Avoid sitting for long periods of time without moving. Get up and move around at least one time each hour.  Exercise and stretch regularly, as told by your health care provider.  Do not lift anything that is heavier than 10 lb (4.5 kg) while you have symptoms of sciatica. When you do not have symptoms, you should still avoid  heavy lifting, especially repetitive heavy lifting.  When you lift objects, always use proper lifting technique, which includes: ? Bending your knees. ? Keeping the load close to your body. ? Avoiding twisting. General instructions  Maintain a healthy weight. Excess weight puts extra stress on your back.  Wear supportive, comfortable shoes. Avoid wearing high heels.  Avoid sleeping on a mattress that is too soft or too hard. A mattress that is firm enough to support your back when you sleep may help to reduce your pain.  Keep all follow-up visits as told by your health care provider. This is important. Contact a health care provider if:  You have pain that: ? Wakes you up when you are sleeping. ? Gets worse when you lie down. ? Is worse than you have experienced in the past. ? Lasts longer than 4 weeks.  You have an unexplained weight loss. Get help right away if:  You are not able to control when you urinate or have bowel movements (incontinence).  You have: ? Weakness in your lower back, pelvis, buttocks, or legs that gets worse. ? Redness or swelling of your back. ? A burning sensation when you urinate. Summary  Sciatica is pain, numbness, weakness, or tingling along the path of the sciatic nerve.  This condition is caused by pressure on the sciatic nerve or pinching of the nerve.  Sciatica can cause pain, numbness, or tingling in the lower back, legs, hips, and buttocks.  Treatment often includes rest, exercise, medicines, and  applying ice or heat. This information is not intended to replace advice given to you by your health care provider. Make sure you discuss any questions you have with your health care provider. Document Revised: 09/17/2018 Document Reviewed: 09/17/2018 Elsevier Patient Education  2020 [employer].

## 2020-07-14 ENCOUNTER — Other Ambulatory Visit: Payer: Self-pay

## 2020-07-14 ENCOUNTER — Ambulatory Visit: Payer: Self-pay | Admitting: Nurse Practitioner

## 2020-07-14 ENCOUNTER — Encounter: Payer: Self-pay | Admitting: Nurse Practitioner

## 2020-07-14 VITALS — HR 94 | Temp 97.9°F | Resp 16 | Ht 68.0 in | Wt 213.0 lb

## 2020-07-14 DIAGNOSIS — M545 Low back pain, unspecified: Secondary | ICD-10-CM

## 2020-07-14 MED ORDER — CLONAZEPAM 0.5 MG PO TABS: 0.5000 mg | ORAL_TABLET | Freq: Every day | ORAL | 0 refills | Status: DC

## 2020-07-14 NOTE — Progress Notes (Signed)
Pt presents today to follow up with lower back pain. CL,RMA

## 2020-07-14 NOTE — Progress Notes (Addendum)
Subjective:    Patient ID: Henry Powell, male    DOB: 1981-03-03, 39 y.o.   MRN: [id number]  HPI 39 year old male here for follow up regarding back injury. He was initially seen on 07/06/20 with complaints of pain for 1 week at that time.  He was initially treated with Medrol dosepak/tramadol & Norflex.   Has since that time stopped all medications and currently is not taking anything for pain. Rates pain 2/10 today.  Has been to the Chiropractor recently has had 4 appointments so far including acupuncture therapy and electrical stimulation.   He has not had any imaging of his back done for this incident.   Has initiated water rehab independently at the [facility] and is walking in the pool.   Pain is worse when he is sitting for long periods of time, relieved when laying down.  Chiropractor note is to return to work on restricted duty, no bending/raking..  Patient has also been struggling with anxiety since his stroke 08/2019. Neurology started him on Lexapro but he did not like SEs and stopped that medication. Was started on Klonopin 05/3020 at [facility] for as needed at night. He has been using that and feels it  Has helped without SEs he takes most nights as his anxiety is worse in the evenings. Requesting refill today.   Today's Vitals   07/14/20 1312  Pulse: 94  Resp: 16  Temp: 97.9 F (36.6 C)  SpO2: 100%  Weight: 213 lb (96.6 kg)  Height: 5\' 8"  (1.727 m)   Body mass index is 32.39 kg/m.   Past Medical History:  Diagnosis Date  . Anxiety state 06/11/2020  . Hypertension   . Stroke [facility])    Past Surgical History:  Procedure Laterality Date  . NO PAST SURGERIES     Review of Systems  Constitutional: Negative.   HENT: Negative.   Respiratory: Negative.   Cardiovascular: Negative.   Genitourinary: Negative.   Musculoskeletal: Positive for back pain.  Psychiatric/Behavioral: The patient is nervous/anxious.        Objective:   Physical Exam Constitutional:       Appearance: Normal appearance.  HENT:     Head: Normocephalic.  Musculoskeletal:     Lumbar back: Tenderness present. Decreased range of motion.       Back:     Right upper leg: Normal.     Left upper leg: Normal.     Right lower leg: Normal.     Left lower leg: Normal.     Comments: Tenderness to left lateral SI joint region to palpation. Strength and sensation intact to bilateral lower extremities.    Neurological:     Mental Status: He is alert and oriented to person, place, and time.  Psychiatric:        Attention and Perception: Attention normal.        Mood and Affect: Mood is anxious.        Speech: Speech normal.        Behavior: Behavior normal. Behavior is cooperative.        Thought Content: Thought content normal.        Cognition and Memory: Cognition normal.        Judgment: Judgment normal.           Assessment & Plan:  Will comply with Chiropractor note to remain on restricted duty for the next 2 weeks, he will continue therapy with them and water therapy independently. RTC earlier with any  worsening symptoms when returning to work.   Will readdress anxiety at next visit, discussed with patient that Klonopin is not a medication to manage anxiety long term, and this should be reserved for as needed episodes. If he continues to need this every night will need to revisit daily medication regimen, explore other options Zoloft/Prozac as appropriate.   Spoke with Dr. Sullivan Lone regarding ongoing Klonopin Rx, would like to attempt a trial of another SSRI for anxiety, OK with refill of Klonopin today.   Patient given BP card and will record blood pressure daily for the next two weeks and will re address this at follow up in 2 weeks. If DBP remains > 100 will need to adjust BP meds.

## 2020-07-15 ENCOUNTER — Other Ambulatory Visit: Payer: Self-pay | Admitting: Nurse Practitioner

## 2020-07-15 DIAGNOSIS — I10 Essential (primary) hypertension: Secondary | ICD-10-CM

## 2020-07-15 MED ORDER — AMLODIPINE BESYLATE 10 MG PO TABS: 10.0000 mg | ORAL_TABLET | Freq: Every day | ORAL | 3 refills | Status: DC

## 2020-07-15 NOTE — Progress Notes (Signed)
Refilled Norvasc at patient request. Appears there are refills available but patient stated was not available at pharmacy when he went yesterday.

## 2020-07-28 ENCOUNTER — Ambulatory Visit: Payer: Self-pay | Admitting: Nurse Practitioner

## 2020-07-28 ENCOUNTER — Other Ambulatory Visit: Payer: Self-pay

## 2020-07-28 VITALS — BP 144/96 | HR 96 | Temp 98.2°F | Resp 16 | Ht 68.0 in | Wt 213.0 lb

## 2020-07-28 DIAGNOSIS — F411 Generalized anxiety disorder: Secondary | ICD-10-CM

## 2020-07-28 MED ORDER — SERTRALINE HCL 25 MG PO TABS: 25.0000 mg | ORAL_TABLET | Freq: Every day | ORAL | 0 refills | Status: DC

## 2020-07-28 NOTE — Progress Notes (Signed)
Subjective:    Patient ID: Henry Powell, male    DOB: Mar 14, 1981, 39 y.o.   MRN: [id number]  HPI  39 year old male here for follow up regarding: 1. Back injury - Has continued to see Chiropractor and will continue sessions with that doctor for the next two weeks. Has one more week on light duty per Chirop. Pain in controlled, is able to perform work duties without increased pain. Is not taking anything for pain. Pain is worse when sitting. Denies any pain to extremities or any weakness associated with pain. Has rejoined the gym but has not started going yet do to limitations from back injury.   2. HTN, patient has kept a log of BP at home- entered into Epic, SBP remains in 140s, DBP remains in 90-100 range. His Norvasc dosage was increased from 5-10 about 6 weeks ago without much of a change in BP readings. Denies HA or chest pain. Patient notes he was started on BP medications at age 30 then was able to normalize BP with exercise and was able to stop. More recently BP has been elevated and was started on Norvasc when hospitalized for stroke.  3. Anxiety- continued to use Klonopin nightly for anxiety, also notes he is anxious throughout the day thinking about "what if" and most anxiety is related to recent stroke and concern over general health. He was given Lexapro initially and stopped due to SEs.   4. Has f/u with Neurology 08/12/20 for stroke follow up.    Today's Vitals   07/21/20 0956 07/22/20 1500 07/28/20 1308  BP: (!) 144/109 (!) 150/110 (!) 144/96  Pulse:   96  Resp:   16  Temp:   98.2 F (36.8 C)  SpO2:   98%  Weight:   213 lb (96.6 kg)  Height:   5\' 8"  (1.727 m)   Body mass index is 32.39 kg/m.   Current Outpatient Medications  Medication Instructions  . amLODipine (NORVASC) 10 mg, Oral, Daily  . aspirin 81 MG chewable tablet Oral, Daily  . atorvastatin (LIPITOR) 80 mg, Oral, Daily-1800  . clonazePAM (KLONOPIN) 0.5 mg, Oral, Daily at bedtime  . cyanocobalamin 1,000  mcg, Oral, Daily  . ezetimibe (ZETIA) 10 mg, Oral, Daily  . lidocaine (LIDODERM) 5 % 1 patch, Transdermal, Every 24 hours, Remove & Discard patch within 12 hours or as directed by MD  . orphenadrine (NORFLEX) 100 mg, Oral, 2 times daily  . sertraline (ZOLOFT) 25 mg, Oral, Daily at bedtime  . traMADol (ULTRAM) 50 mg, Oral, Every 6 hours PRN    Past Medical History:  Diagnosis Date  . Anxiety state 06/11/2020  . Hypertension   . Stroke [facility])    Review of Systems  Constitutional: Negative.   HENT: Negative.   Respiratory: Negative.   Cardiovascular: Negative.   Musculoskeletal: Positive for myalgias.  Neurological: Negative.   Psychiatric/Behavioral: The patient is nervous/anxious.    PHQ9- score of 9  GAD7 - score of 12  Denies any suicidal/homicidal  ideation     Objective:   Physical Exam Constitutional:      Appearance: Normal appearance.  Musculoskeletal:        General: Tenderness present.       Legs:  Skin:    General: Skin is warm and dry.  Neurological:     Mental Status: He is alert.  Psychiatric:        Attention and Perception: Attention and perception normal.  Mood and Affect: Mood is anxious.        Speech: Speech normal.        Behavior: Behavior is cooperative.        Thought Content: Thought content normal.        Cognition and Memory: Cognition and memory normal.        Judgment: Judgment normal.           Assessment & Plan:  Would like to start one medication today to prevent confusion in potential SEs and prevent potential hypotensive episodes, have patient return in one week for potential release to full duty/BP check and SSRI check in.   Will target anxiety control today as this may be a factor in ongoing HTN, (doubling Norvasc has so far not improved BP- will consider second agent if BP remains high on SSRI).   Start Zoloft tonight. Stop Klonopin and use only as needed going forward. Discussed potential SEs of SSRI- will RTC in one  week for SSRI check in. May increase Zoloft to 50mg  at that time if SEs are controlled.   Also discussed EAP counseling services, gave patient phone number and encouraged using these services in conjunction with SSRI management for anxiety. He will call to set up appointment.   Two week RTC for SSRI check in. If BP remains elevated will add secondary agent at that time for HTN control. May consider repeat EKG as last was 08/2019 ([facility]).   Also discussed potential of Cardiology referral, will also discuss with Neurology at f/u appointment.   Consider release to full duty in one week if back pain continues to be controlled.     RTC of seek immediate medical attention for any acute SEs when starting SSRI as discussed.   Meds ordered this encounter  Medications  . sertraline (ZOLOFT) 25 MG tablet    Sig: Take 1 tablet (25 mg total) by mouth at bedtime for 14 days.    Dispense:  14 tablet    Refill:  0

## 2020-08-03 ENCOUNTER — Encounter: Payer: Self-pay | Admitting: Nurse Practitioner

## 2020-08-03 ENCOUNTER — Ambulatory Visit: Payer: Self-pay | Admitting: Nurse Practitioner

## 2020-08-03 ENCOUNTER — Other Ambulatory Visit: Payer: Self-pay

## 2020-08-03 VITALS — BP 131/104 | HR 91 | Temp 98.6°F | Resp 12

## 2020-08-03 DIAGNOSIS — Z09 Encounter for follow-up examination after completed treatment for conditions other than malignant neoplasm: Secondary | ICD-10-CM

## 2020-08-03 NOTE — Progress Notes (Signed)
   Subjective:    Patient ID: Henry Powell, male    DOB: Jan 26, 1981, 39 y.o.   MRN: [id number]  HPI Henry Powell is a 39 y.o. male who presents to the [facility] for recheck of his low back pain.  Patient of Dr. [name] of [name] - Has a work note dated 08/03/20 - Limited work activities as follows:  No shovel or rake this week until follow-up with specialist on 08/11/20. Receiving acupunctute, electric stem & adjustment. No home exercises given. No medications from her. Durward Parcel, PA-C previously prescribed Lidocaine patches, but says he's not using them. Not OTC meds such as Tylenol or Ibuprofen are being used.  States pain is a lot better than it was before - 10x better with chiopracter  Review of Systems  Musculoskeletal: Positive for back pain.  All other systems reviewed and are negative.      Objective: BP (!) 131/104 (BP Location: Left Arm, Patient Position: Sitting, Cuff Size: Large)   Pulse 91   Temp 98.6 F (37 C) (Oral)   Resp 12   SpO2 97%     Physical Exam Vitals and nursing note reviewed.  Constitutional:      General: He is not in acute distress.    Appearance: He is well-developed.  HENT:     Head: Normocephalic and atraumatic.  Eyes:     General: Lids are normal.     Conjunctiva/sclera: Conjunctivae normal.     Pupils: Pupils are equal, round, and reactive to light.  Cardiovascular:     Rate and Rhythm: Normal rate.  Pulmonary:     Effort: Pulmonary effort is normal. No respiratory distress.     Breath sounds: Normal air entry.  Abdominal:     Palpations: Abdomen is soft.     Tenderness: There is no abdominal tenderness.  Musculoskeletal:        General: Normal range of motion.     Cervical back: Normal, normal range of motion and neck supple.     Lumbar back: No deformity, spasms or bony tenderness. Tenderness: minimal. Normal range of motion. No scoliosis.  Skin:    General: Skin is warm and dry.   Neurological:     Mental Status: He is alert and oriented to person, place, and time.     Cranial Nerves: No cranial nerve deficit.     Sensory: Sensation is intact.     Motor: Motor function is intact.     Gait: Gait is intact.     Deep Tendon Reflexes:     Reflex Scores:      Bicep reflexes are 2+ on the right side and 2+ on the left side.      Brachioradialis reflexes are 2+ on the right side and 2+ on the left side.      Patellar reflexes are 2+ on the right side and 2+ on the left side. Psychiatric:        Mood and Affect: Mood normal.        Behavior: Behavior normal.       Assessment & Plan:  1. Follow-up exam after treatment 39 y.o. male here for f/u after treatment by Chiropractor and feeling much better. He will continue treatment as they have him on limited duty and a f/u appointment 11/30. Patient has f/u appointment here with Maralyn Sago, NP next week to reassess BP after medication adjustment and starting a new medication. Patient appears stable for d/c.

## 2020-08-03 NOTE — Progress Notes (Signed)
See notes from 07/14/20 when Henry Powell saw Henry Simas, FNP.  Patient of Dr. [name] & Dr. [name]  of [name] - Has a work note dated 08/03/20 - Limited work activities as follows:  No shovel or rake this week until follow-up with specialist on 08/11/20. Receiving acupunctute, electric stem & adjustment. No home exercises given. No medications from her. Henry Parcel, Henry Powell previously prescribed Lidocaine patches, but says he's not using them. Not OTC meds such as Tylenol or Ibuprofen are being used.  States pain is a lot better than it was before - 10x better with chiropracter care.  States Dr. [name] told him he might need 18 visits, but he's only been 8 times.  Currently seeing chiropracter 2x/week.  Only 1x this week, but has two visits scheduled for next week.  AMD

## 2020-08-03 NOTE — Patient Instructions (Signed)
Muscle Strain A muscle strain is an injury that happens when a muscle is stretched longer than normal. This can happen during a fall, sports, or lifting. This can tear some muscle fibers. Usually, recovery from muscle strain takes 1-2 weeks. Complete healing normally takes 5-6 weeks. This condition is first treated with PRICE therapy. This involves:  Protecting your muscle from being injured again.  Resting your injured muscle.  Icing your injured muscle.  Applying pressure (compression) to your injured muscle. This may be done with a splint or elastic bandage.  Raising (elevating) your injured muscle. Your doctor may also recommend medicine for pain. Follow these instructions at home: If you have a splint:  Wear the splint as told by your doctor. Take it off only as told by your doctor.  Loosen the splint if your fingers or toes tingle, get numb, or turn cold and blue.  Keep the splint clean.  If the splint is not waterproof: ? Do not let it get wet. ? Cover it with a watertight covering when you take a bath or a shower. Managing pain, stiffness, and swelling   If directed, put ice on your injured area. ? If you have a removable splint, take it off as told by your doctor. ? Put ice in a plastic bag. ? Place a towel between your skin and the bag. ? Leave the ice on for 20 minutes, 2-3 times a day.  Move your fingers or toes often. This helps to avoid stiffness and lessen swelling.  Raise your injured area above the level of your heart while you are sitting or lying down.  Wear an elastic bandage as told by your doctor. Make sure it is not too tight. General instructions  Take over-the-counter and prescription medicines only as told by your doctor.  Limit your activity. Rest your injured muscle as told by your doctor. Your doctor may say that gentle movements are okay.  If physical therapy was prescribed, do exercises as told by your doctor.  Do not put pressure on any  part of the splint until it is fully hardened. This may take many hours.  Do not use any products that contain nicotine or tobacco, such as cigarettes and e-cigarettes. These can delay bone healing. If you need help quitting, ask your doctor.  Warm up before you exercise. This helps to prevent more muscle strains.  Ask your doctor when it is safe to drive if you have a splint.  Keep all follow-up visits as told by your doctor. This is important. Contact a doctor if:  You have more pain or swelling in your injured area. Get help right away if:  You have any of these problems in your injured area: ? You have numbness. ? You have tingling. ? You lose a lot of strength. Summary  A muscle strain is an injury that happens when a muscle is stretched longer than normal.  This condition is first treated with PRICE therapy. This includes protecting, resting, icing, adding pressure, and raising your injury.  Limit your activity. Rest your injured muscle as told by your doctor. Your doctor may say that gentle movements are okay.  Warm up before you exercise. This helps to prevent more muscle strains. This information is not intended to replace advice given to you by your health care provider. Make sure you discuss any questions you have with your health care provider. Document Revised: 10/25/2018 Document Reviewed: 10/05/2016 Elsevier Patient Education  2020 [employer] Inc[employer]  

## 2020-08-10 ENCOUNTER — Encounter: Payer: Self-pay | Admitting: Nurse Practitioner

## 2020-08-10 ENCOUNTER — Other Ambulatory Visit: Payer: Self-pay

## 2020-08-10 ENCOUNTER — Ambulatory Visit: Payer: Self-pay | Admitting: Nurse Practitioner

## 2020-08-10 DIAGNOSIS — F411 Generalized anxiety disorder: Secondary | ICD-10-CM

## 2020-08-10 MED ORDER — SERTRALINE HCL 25 MG PO TABS: 25.0000 mg | ORAL_TABLET | Freq: Every day | ORAL | 0 refills | Status: DC

## 2020-08-10 NOTE — Progress Notes (Signed)
Pt presents today to follow up with anxiety. Pt states things have been fine, but did notice that the medication sertraline has been giving him headaches. CL,RMA

## 2020-08-10 NOTE — Progress Notes (Signed)
   Subjective:    Patient ID: Henry Powell, male    DOB: 01/06/1981, 39 y.o.   MRN: [id number]  HPI  39 year old male here for follow up after starting Zoloft 2 weeks ago. Was previously using Klonopin to control anxiety, has since been able to stop.   He has been experiencing some mild headaches since starting zoloft but feels they are improving/ less obvious now and feels his sleep has improved.   Is still on modified work release due to back injury- following up with Chiropractor.   Has follow up with Neurology in two weeks for f/u after stroke  Current Outpatient Medications  Medication Instructions  . amLODipine (NORVASC) 10 mg, Oral, Daily  . aspirin 81 MG chewable tablet Oral, Daily  . atorvastatin (LIPITOR) 80 mg, Oral, Daily-1800  . clonazePAM (KLONOPIN) 0.5 mg, Oral, Daily at bedtime  . cyanocobalamin 1,000 mcg, Oral, Daily  . ezetimibe (ZETIA) 10 mg, Oral, Daily  . lidocaine (LIDODERM) 5 % 1 patch, Transdermal, Every 24 hours, Remove & Discard patch within 12 hours or as directed by MD  . orphenadrine (NORFLEX) 100 mg, Oral, 2 times daily  . sertraline (ZOLOFT) 25 mg, Oral, Daily at bedtime  . traMADol (ULTRAM) 50 mg, Oral, Every 6 hours PRN    Review of Systems  Constitutional: Negative.   HENT: Negative.   Musculoskeletal: Positive for back pain.  Neurological: Positive for headaches.  Psychiatric/Behavioral: The patient is nervous/anxious.    Today's Vitals   08/10/20 1410  BP: (!) 137/95  Pulse: 87  Resp: 16  Temp: 98.3 F (36.8 C)  SpO2: 97%  Weight: 213 lb (96.6 kg)  Height: 5\' 8"  (1.727 m)   Body mass index is 32.39 kg/m.    Objective:   Physical Exam Constitutional:      Appearance: Normal appearance.  HENT:     Head: Normocephalic.  Skin:    General: Skin is warm and dry.  Neurological:     Mental Status: He is alert and oriented to person, place, and time. Mental status is at baseline.  Psychiatric:        Attention and Perception:  Attention and perception normal.        Mood and Affect: Mood and affect normal.        Speech: Speech normal.        Behavior: Behavior normal. Behavior is cooperative.        Thought Content: Thought content normal.        Cognition and Memory: Cognition normal.        Judgment: Judgment normal.     PHQ9 = 3 GAD = 5      Assessment & Plan:  Plan is to continue Zoloft at 25mg  and follow up again in 2 weeks.   Patient to see Neurology in two weeks   BP improving off klonopin with zoloft will continue to monitor and adjust medications as needed. Continue to monitor BP at home, discussed dietary recommendations, decreasing salt and fast food.   RTC earlier with any new concerns   Meds ordered this encounter  Medications  . sertraline (ZOLOFT) 25 MG tablet    Sig: Take 1 tablet (25 mg total) by mouth at bedtime.    Dispense:  90 tablet    Refill:  0

## 2020-08-17 ENCOUNTER — Ambulatory Visit: Payer: Self-pay | Admitting: Physician Assistant

## 2020-08-24 ENCOUNTER — Ambulatory Visit: Payer: Self-pay | Admitting: Adult Health

## 2020-08-25 ENCOUNTER — Ambulatory Visit: Payer: Self-pay | Admitting: Nurse Practitioner

## 2020-08-25 ENCOUNTER — Other Ambulatory Visit: Payer: Self-pay

## 2020-08-25 VITALS — BP 140/96 | HR 90 | Temp 98.4°F | Resp 16 | Ht 68.0 in | Wt 214.0 lb

## 2020-08-25 DIAGNOSIS — I10 Essential (primary) hypertension: Secondary | ICD-10-CM

## 2020-08-25 MED ORDER — SERTRALINE HCL 50 MG PO TABS: 50.0000 mg | ORAL_TABLET | Freq: Every day | ORAL | 3 refills | Status: DC

## 2020-08-25 NOTE — Progress Notes (Signed)
   Subjective:    Patient ID: Henry Powell, male    DOB: Nov 20, 1980, 39 y.o.   MRN: [id number]  HPI  39 year old male presenting today at [facility] for follow up on anxiety. He has been on Zoloft 25mg  for one month now.   Biggest complaint is ongoing worry over another stroke and difficulty staying asleep.   PHQ9= 3 GAD 7= 8  Denies SEs from Zoloft. Has not used Klonopin in past 2 weeks.   Seeing Neurology first week of January for f/u   BP remains elevated, on Norvasc currently. Has not seen cardiology outpatient since stroke occurrence 2020.  Has been released back to work from 2021 after back injury at work.   Today's Vitals   08/25/20 1410  BP: (!) 140/96  Pulse: 90  Resp: 16  Temp: 98.4 F (36.9 C)  SpO2: 97%  Weight: 214 lb (97.1 kg)  Height: 5\' 8"  (1.727 m)   Body mass index is 32.54 kg/m.  Review of Systems  Constitutional: Negative.   HENT: Negative.   Respiratory: Negative.   Cardiovascular: Negative.   Psychiatric/Behavioral: Positive for sleep disturbance. Negative for agitation, self-injury and suicidal ideas. The patient is nervous/anxious.    Past Medical History:  Diagnosis Date  . Anxiety state 06/11/2020  . Hypertension   . Stroke [facility])       Objective:   Physical Exam Constitutional:      General: He is not in acute distress. Neurological:     Mental Status: He is alert.  Psychiatric:        Attention and Perception: Attention normal.        Mood and Affect: Mood is anxious.        Speech: Speech normal.        Behavior: Behavior normal.        Thought Content: Thought content normal.        Cognition and Memory: Cognition normal.        Judgment: Judgment normal.           Assessment & Plan:  Plan to increase Zoloft to 50mg  this week, would like patient to follow up in 2-4 weeks with new provider at clinic to establish care.   RTC earlier with any concerns with increased dose, SEs or  worsening symptoms.   Discussed with patient due to history of stroke and HTN he should see cardiology to continue workup from hospital stay. Will refer to Cardiology today.   Continue to monitor BP at home, continue current medications otherwise.   Meds ordered this encounter  Medications  . sertraline (ZOLOFT) 50 MG tablet    Sig: Take 1 tablet (50 mg total) by mouth daily.    Dispense:  30 tablet    Refill:  3

## 2020-08-28 ENCOUNTER — Other Ambulatory Visit: Payer: Self-pay | Admitting: Physician Assistant

## 2020-08-28 MED ORDER — CLONIDINE HCL 0.1 MG PO TABS: 0.1000 mg | ORAL_TABLET | Freq: Two times a day (BID) | ORAL | 3 refills | Status: DC

## 2020-08-31 ENCOUNTER — Encounter: Payer: Self-pay | Admitting: Physician Assistant

## 2020-08-31 ENCOUNTER — Ambulatory Visit: Payer: Self-pay | Admitting: Physician Assistant

## 2020-08-31 ENCOUNTER — Other Ambulatory Visit: Payer: Self-pay

## 2020-08-31 VITALS — BP 123/81 | Resp 14 | Ht 68.0 in | Wt 213.0 lb

## 2020-08-31 DIAGNOSIS — I10 Essential (primary) hypertension: Secondary | ICD-10-CM

## 2020-08-31 DIAGNOSIS — I2510 Atherosclerotic heart disease of native coronary artery without angina pectoris: Secondary | ICD-10-CM

## 2020-08-31 DIAGNOSIS — R7303 Prediabetes: Secondary | ICD-10-CM

## 2020-08-31 MED ORDER — CLONIDINE HCL 0.1 MG PO TABS: 0.1000 mg | ORAL_TABLET | Freq: Three times a day (TID) | ORAL | 3 refills | Status: DC

## 2020-08-31 NOTE — Progress Notes (Signed)
   Subjective:    Patient ID: Henry Powell, male    DOB: 1981/07/30, 39 y.o.   MRN: [id number]  HPI  ~39 yr male under care of neurology ischemic stroke12/20 Reports 2wk prior to have fallen on face while skating  then developed balance, vision headache. Htn reportedly without prior history now with persistent   BP x 6-68m.  Clonidine 0.1 bid was added stat to due to high incidence CNS exacerbation    Review of Systems Pertinent negatives include no hx headache, visual or balance disturbance, weakness in last 3wks. Denies memory cognition change. Currently working  Otherwise nl RoS  Fh +father HTN   [place]etoh socially esp football season    Objective:   Physical Exam  bp 123/81  wn wf wm Heent: [place] atraumatic, Perrla no anisocoria,  undilated pupil    Optic disc distinct, disc ratio appears nl,  AV nl in field of view   Thyroid not enlarged Chest  wnl Abd increase girth,   Neuro/Extr nl gait  Slight deviation rt tongue crn10, slight decr reflex rt brachioradialis, knee, ankle with nl strength, Rom      Assessment & Plan:  Plan to follow up BP in 24hr with update of abn lab with FBS HDL LPa Gttp values  Maintain Clonidine 0.1 tid 8a-3p-10p, discontinue Norvasc Patient will continue reporting ambulatory BP over next 48h   to document efficacy   Begin to limit excessive Etoh intake

## 2020-08-31 NOTE — Addendum Note (Signed)
Addended by: Christianne Dolin F on: 08/31/2020 02:15 PM   Modules accepted: Orders

## 2020-09-01 ENCOUNTER — Other Ambulatory Visit: Payer: Self-pay

## 2020-09-01 DIAGNOSIS — I10 Essential (primary) hypertension: Secondary | ICD-10-CM

## 2020-09-01 DIAGNOSIS — R7303 Prediabetes: Secondary | ICD-10-CM

## 2020-09-01 DIAGNOSIS — I2510 Atherosclerotic heart disease of native coronary artery without angina pectoris: Secondary | ICD-10-CM

## 2020-09-02 LAB — GAMMA GT: GGT: 32 IU/L (ref 0–65)

## 2020-09-02 LAB — HGB A1C W/O EAG: Hgb A1c MFr Bld: 6 % — ABNORMAL HIGH (ref 4.8–5.6)

## 2020-09-02 LAB — GLUCOSE, RANDOM: Glucose: 104 mg/dL — ABNORMAL HIGH (ref 65–99)

## 2020-09-02 LAB — LIPOPROTEIN A (LPA): Lipoprotein (a): 247.5 nmol/L — ABNORMAL HIGH (ref <75.0)

## 2020-09-10 ENCOUNTER — Other Ambulatory Visit: Payer: Self-pay

## 2020-09-10 DIAGNOSIS — Z1152 Encounter for screening for COVID-19: Secondary | ICD-10-CM

## 2020-09-12 LAB — NOVEL CORONAVIRUS, NAA: SARS-CoV-2, NAA: NOT DETECTED

## 2020-09-12 LAB — SARS-COV-2, NAA 2 DAY TAT

## 2020-09-14 ENCOUNTER — Other Ambulatory Visit: Payer: Self-pay | Admitting: Nurse Practitioner

## 2020-09-14 NOTE — Progress Notes (Signed)
  Phone conversation with patient: He was exposed to someone who tested positive for COVID last Tuesday 09/08/20   Thursday 09/10/20 tested negative with PCR at [facility]   Saturday 09/12/20 started to have symptom including: Congestion, headache, and cough  Patient took a Home test Sunday 09/13/20 yesterday and tested positive   He is Not vaccinated.   Past Medical History:  Diagnosis Date  . Anxiety state 06/11/2020  . Hypertension   . Stroke [facility])    Current Outpatient Medications  Medication Instructions  . aspirin 81 MG chewable tablet Oral, Daily  . atorvastatin (LIPITOR) 80 mg, Oral, Daily-1800  . clonazePAM (KLONOPIN) 0.5 mg, Oral, Daily at bedtime  . cloNIDine (CATAPRES) 0.1 mg, Oral, 3 times daily  . cyanocobalamin 1,000 mcg, Oral, Daily  . ezetimibe (ZETIA) 10 mg, Oral, Daily  . orphenadrine (NORFLEX) 100 mg, Oral, 2 times daily  . sertraline (ZOLOFT) 50 mg, Oral, Daily  . traMADol (ULTRAM) 50 mg, Oral, Every 6 hours PRN    Increased risk due to co morbidities will refer to [facility] for evaluation and treatment.   Follow up for blood pressure management and lab review 09/17/20 per current CDC guidelines isolation will end 09/16/20 so he should be OK to return to clinic in person if symptoms are improving and he is masked on 09/17/20  He will follow up earlier with new or worsening symptoms as discussed

## 2020-09-15 ENCOUNTER — Other Ambulatory Visit: Payer: Self-pay

## 2020-09-15 ENCOUNTER — Ambulatory Visit: Payer: Self-pay | Admitting: Adult Health

## 2020-09-17 ENCOUNTER — Ambulatory Visit: Payer: 59

## 2020-09-18 ENCOUNTER — Ambulatory Visit: Payer: Self-pay | Admitting: Physician Assistant

## 2020-09-18 ENCOUNTER — Other Ambulatory Visit: Payer: Self-pay

## 2020-09-18 ENCOUNTER — Encounter: Payer: Self-pay | Admitting: Physician Assistant

## 2020-09-18 VITALS — BP 122/93 | HR 88 | Temp 98.3°F | Resp 14 | Ht 68.0 in | Wt 208.0 lb

## 2020-09-18 DIAGNOSIS — I2510 Atherosclerotic heart disease of native coronary artery without angina pectoris: Secondary | ICD-10-CM

## 2020-09-18 NOTE — Progress Notes (Signed)
   Subjective: Hypertension    Patient ID: Henry Powell, male    DOB: [birth year], 40 y.o.   MRN: [id number]  HPI Patient presents for evaluation of hypertension secondary to change her therapy.  Patient will discontinue Norvasc advised to stop clonidine 0.1 mg 3 times daily.  Patient also requests lab results for fasting blood sugar, HDL, Gttp, and LPa.   Review of Systems    Status post CVA, hypertension, and hyperlipidemia. Objective:   Physical Exam BP 122/93.  Pulse 88.  Respiration 14.  Temperature 98.3. Patient lipoprotein (a) 247.5.  Patient G GGT is 32.  Patient random glucose 104.  Patient hemoglobin A1c 6.0.      Assessment & Plan: Hyperlipidemia and hypertension.  Continue previous medications and schedule for 64-month follow-up with lipid panel to include lipoprotein (a).

## 2020-09-21 ENCOUNTER — Other Ambulatory Visit: Payer: Self-pay

## 2020-09-29 ENCOUNTER — Other Ambulatory Visit: Payer: Self-pay

## 2020-09-29 DIAGNOSIS — Z0283 Encounter for blood-alcohol and blood-drug test: Secondary | ICD-10-CM
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
## 2020-10-15 ENCOUNTER — Encounter: Payer: Self-pay | Admitting: Adult Medicine

## 2020-10-15 ENCOUNTER — Ambulatory Visit: Payer: Self-pay | Admitting: Adult Medicine

## 2020-10-15 ENCOUNTER — Other Ambulatory Visit: Payer: Self-pay

## 2020-10-15 VITALS — BP 124/87 | HR 80 | Temp 98.3°F | Resp 14 | Ht 68.0 in | Wt 209.0 lb

## 2020-10-15 DIAGNOSIS — I10 Essential (primary) hypertension: Secondary | ICD-10-CM

## 2020-10-15 DIAGNOSIS — E7801 Familial hypercholesterolemia: Secondary | ICD-10-CM

## 2020-10-15 DIAGNOSIS — I2510 Atherosclerotic heart disease of native coronary artery without angina pectoris: Secondary | ICD-10-CM

## 2020-10-15 MED ORDER — VITAMIN D (ERGOCALCIFEROL) 1.25 MG (50000 UNIT) PO CAPS: 50000.0000 [IU] | ORAL_CAPSULE | ORAL | 1 refills | Status: AC

## 2020-10-15 MED ORDER — ROSUVASTATIN CALCIUM 40 MG PO TABS: 40.0000 mg | ORAL_TABLET | Freq: Every day | ORAL | 3 refills | Status: DC

## 2020-10-15 MED ORDER — OMEGA-3-ACID ETHYL ESTERS 1 G PO CAPS: 2.0000 g | ORAL_CAPSULE | Freq: Two times a day (BID) | ORAL | 3 refills | Status: DC

## 2020-10-15 NOTE — Progress Notes (Signed)
Subjective:    Patient ID: Henry Powell, male    DOB: 08-07-1981, 40 y.o.   MRN: [id number]  HPI   39y with 2020 hx cerebellar non extravasating CVA  With htn controlled on catapres who has retained full function. Here to follow up lab test. 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   HENT: Negative.   Eyes: Negative.   Respiratory: Negative.   Cardiovascular: Negative.   Gastrointestinal: Negative.   Endocrine: Negative.   Genitourinary: Negative.   Musculoskeletal: Negative.   Neurological: Negative.        Objective:   Physical Exam Constitutional:      Appearance: Normal appearance.  HENT:     Head: Normocephalic and atraumatic.     Right Ear: Tympanic membrane normal.     Left Ear: Tympanic membrane normal.     Nose: Nose normal.     Mouth/Throat:     Mouth: Mucous membranes are moist.  Eyes:     Extraocular Movements: Extraocular movements intact.     Pupils: Pupils are equal, round, and reactive to light.  Cardiovascular:     Rate and Rhythm: Normal rate and regular rhythm.  Pulmonary:     Effort: Pulmonary effort is normal.     Breath sounds: Normal breath sounds.  Abdominal:     General: Abdomen is flat.     Palpations: Abdomen is soft.   Musculoskeletal:        General: Normal range of motion.     Cervical back: Normal range of motion and neck supple.  Skin:    General: Skin is warm.  Neurological:     General: No focal deficit present.     Mental Status: He is alert and oriented to person, place, and time.     Cranial Nerves: No cranial nerve deficit.     Sensory: No sensory deficit.     Motor: No weakness.     Coordination: Coordination normal.     Gait: Gait normal.     Deep Tendon Reflexes: Reflexes normal.    Lipoprotein (a) <75.0 nmol/L 247.5High    Component Ref Range & Units 1 mo ago  (09/01/20) 7 mo ago  (02/19/20) 1 yr ago  (08/26/19) 1 yr ago  (08/25/19) 1 yr ago  (08/24/19) 1 yr ago  (08/23/19) 1 yr ago  (08/21/19)  Glucose 65 - 99 mg/dL 102HENI  778EUMP  536RWER R  100High R  101High R  106High R  103High R    Hgb A1c MFr Bld 4.8- 5.6 % 6.0High  5.5 CM  5.9High CM            Assessment & Plan:   Htn much  better control to date states home value running 120s/80s  Back to full duty work without complaint Hyperlipidemia    Discussed zetia, lipitor  Will advance to crestor   Discussed diet. Reduce high intake of sat. fat   Will refer for nutrition education to learn euglycemic food,    exercise, diet selection F/u with pmd Neuro pushed back to April. must get earlier appt then April,  office call by Cob RN changed to next week successful.    Mri with contrast pending Neuro review

## 2020-10-16 NOTE — Progress Notes (Signed)
Contacted [facility] at [phone] Misty Stanley) & appointment changed to 10/20/20 at 1:15pm.  Jill Alexanders notified of change in appt date/time.  Dr. Fran Lowes informed of new appt date/time.  AMD

## 2020-10-16 NOTE — Addendum Note (Signed)
Addended by: Paschal Dopp, Temitope Griffing M on: 10/16/2020 04:00 PM   Modules accepted: Orders

## 2020-10-20 ENCOUNTER — Ambulatory Visit: Payer: Self-pay | Admitting: Adult Health

## 2020-10-20 ENCOUNTER — Encounter: Payer: Self-pay | Admitting: Adult Health

## 2020-10-20 NOTE — Progress Notes (Deleted)
[facility] [address] [place]. [place] [address] [phone]       STROKE FOLLOW UP NOTE  [name] Date of Birth:  [birth year] Medical Record Number:  [id number]   Reason for Referral: stroke follow up    CHIEF COMPLAINT:  No chief complaint on file.   HPI:         History provided for reference purposes only Update 02/20/2020 JM: Henry Powell returns for stroke follow-up.  He has been stable since prior visit. He continues to have occasional dizziness but improving and able to compensate better.  He continues on aspirin and atorvastatin 80 mg daily for secondary stroke prevention.  Blood pressure today 153/109 -does not routinely monitor at home but does endorse compliance with amlodipine.  Anxiety post stroke greatly improved and only occasionally takes Lexapro without any worsening of symptoms.  Continues on B12 supplementation for evidence of B12 deficiency during admission with prior lab work satisfactory.  Prior heavy alcohol use approximately 10 beers per day which she has since decreased usually around 3 to 4/day depending on the day and other days he may have none.  [facility] [employer] physicians and balance physical next week.  No concerns at this time.  Update 11/14/2019 JM: Henry Powell is a 40 year old being seen today, 11/14/2019, who is being seen today for stroke follow up as well as potentially returning back to work without restrictions. Since prior visit, he has been stable from a stroke standpoint with residual occasional dizziness but overall improving with decreasing occurrences.  Repeat CTA on 11/07/2019 showed resolution of prior dissection without underlying stenosis or chronic dissection.  Repeat homocystine level 12.4 which was elevated during admission.  Repeat B12 871 currently on supplementation for evidence of deficiency during admission.  During admission he continues on aspirin and Plavix which will be completed in the next week for  total 59-month duration and denies bleeding or bruising.  Continues on atorvastatin without myalgias.  Repeat lipid panel showed excellent improvement of LDL at 79 (previously 168).  Blood pressure today 128/84.  Initiated Lexapro after prior visit for poststroke anxiety with great improvement.  No further concerns at this time.  Initial visit 10/02/2019 JM: Henry Powell is a 40 year old male who is being seen today for hospital follow-up accompanied by his mother.  Residual deficits of occasional dizziness, mild fatigue and generalized headaches but endorses ongoing improvement.  Reports headaches are generalized and dull type sensation with pain level 3/10 and will take Tylenol with benefit.  Headaches are not daily and are not associated with any other symptoms.  Denies neck pain.  Dizziness typically occurs while bending over or quickly changing positions.  Sensation lasts only a couple seconds and then subsides.  Denies any difficulty with balance or gait.  He also endorses anxiety post stroke without prior anxiety or depression.  He has returned to work on a part-time basis until today's follow-up for possible return for full-time.  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  He does not endorse difficulty with performing job duties with part-time work.  He does live alone and has split custody of his 44 and 62-year-old children.  Able to maintain ADLs and IADLs independently without difficulty.  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  He has not returned back to country club or [employer] at this time.  He has  continued on aspirin and Plavix without bleeding or bruising.  Continues on atorvastatin without myalgias.  Blood pressure today 136/95.  He endorses complete cessation of smokeless tobacco since hospital discharge as well as alcohol use.  He is also continued on cyanocobalamin 1000 mcg  daily for B12 deficiency.  Further review of lab work obtained during admission as some results are still pending in regards to hypercoagulable work-up.  Homocysteine level was slightly elevated at 16.4.  All other within normal limits.  Denies new or worsening stroke/TIA symptoms.  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  MRI revealed fairly large cerebellar stroke and transferred to [facility] for further monitoring given concern for edema.  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  2D echo showed an EF of 55 to 60% without cardiac source of embolus identified.  TCD w/ bubble negative for PFO. LE Dopplers negative for DVT.  Hypercoagulable labs with some resulted negative but some pending at discharge.  Recommended DAPT for 3 months then aspirin alone.  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  Cerebral edema with induced hyponatremia treated with 3% with stabilization on repeat imaging.  Hypertensive urgency upon arrival with BP 184/114 treated with Cleviprex and initiated amlodipine 5 mg daily with long-term BP normotensive range.  LDL 168 and initiated atorvastatin 80 mg daily.  Current tobacco use with smoking cessation counseling provided.  Alcohol use reporting 7-8 drinks per day with alcohol abuse counseling provided.  B12 deficiency noted and initiated B12 supplement.  Other stroke risk factors include overweight and family history of stroke but no personal history of stroke.  He was discharged home in stable condition.        ROS:   14 system review of systems  performed and negative with exception of occasional dizziness  PMH:  Past Medical History:  Diagnosis Date  . Anxiety state 06/11/2020  . Hypertension   . Stroke [facility])     PSH:  Past Surgical History:  Procedure Laterality Date  . NO PAST SURGERIES      Social History:  Social History   Socioeconomic History  . Marital status: Divorced    Spouse name: Not on file  . Number of children: Not on file  . Years of education: Not on file  . Highest education level: Not on file  Occupational History  . Not on file  Tobacco Use  . Smoking status: Never Smoker  . Smokeless tobacco: Former Neurosurgeon    Types: Chew  Substance and Sexual Activity  . Alcohol use: Yes    Alcohol/week: 56.0 standard drinks    Types: 56 Cans of beer per week  . Drug use: Never  . Sexual activity: Not on file  Other Topics Concern  . Not on file  Social History Narrative   Henry Powell works, please call after 4:00 pm.   Social Determinants of Health   Financial Resource Strain: Not on file  Food Insecurity: Not on file  Transportation Needs: Not on file  Physical Activity: Not on file  Stress: Not on file  Social Connections: Not on file  Intimate Partner Violence: Not  on file    Family History:  Family History  Problem Relation Age of Onset  . Hypertension Mother   . Diabetes Mother   . Hypertension Father   . Stroke Paternal Uncle   . Stroke Paternal Uncle     Medications:   Current Outpatient Medications on File Prior to Visit  Medication Sig Dispense Refill  . aspirin 81 MG chewable tablet Chew by mouth daily.    . cloNIDine (CATAPRES) 0.1 MG tablet Take 1 tablet (0.1 mg total) by mouth 3 (three) times daily. 90 tablet 3  . cyanocobalamin 1000 MCG tablet Take 1 tablet (1,000 mcg total) by mouth daily. 90 tablet 2  . ezetimibe (ZETIA) 10 MG tablet Take 1 tablet (10 mg total) by mouth daily. 90 tablet 3  . omega-3 acid ethyl esters (LOVAZA) 1 g capsule Take 2 capsules (2 g total) by  mouth 2 (two) times daily. 120 capsule 3  . orphenadrine (NORFLEX) 100 MG tablet Take 1 tablet (100 mg total) by mouth 2 (two) times daily. 10 tablet 0  . rosuvastatin (CRESTOR) 40 MG tablet Take 1 tablet (40 mg total) by mouth daily. 90 tablet 3  . sertraline (ZOLOFT) 50 MG tablet Take 1 tablet (50 mg total) by mouth daily. 30 tablet 3  . Vitamin D, Ergocalciferol, (DRISDOL) 1.25 MG (50000 UNIT) CAPS capsule Take 1 capsule (50,000 Units total) by mouth every 7 (seven) days. 4 capsule 1   No current facility-administered medications on file prior to visit.    Allergies:  No Known Allergies   Physical Exam  There were no vitals filed for this visit. There is no height or weight on file to calculate BMI. No exam data present  General: well developed, well nourished,  pleasant middle-aged Caucasian male, seated, in no evident distress Head: head normocephalic and atraumatic.   Neck: supple with no carotid or supraclavicular bruits Cardiovascular: regular rate and rhythm, no murmurs Musculoskeletal: no deformity Skin:  no rash/petichiae Vascular:  Normal pulses all extremities   Neurologic Exam Mental Status: Awake and fully alert.   Normal speech and language.  Oriented to place and time. Recent and remote memory intact. Attention span, concentration and fund of knowledge appropriate. Mood and affect appropriate.  Cranial Nerves: Pupils equal, briskly reactive to light. Extraocular movements full without nystagmus. Visual fields full to confrontation. Hearing intact. Facial sensation intact. Face, tongue, palate moves normally and symmetrically.  Motor: Normal bulk and tone. Normal strength in all tested extremity muscles. Sensory.: intact to touch , pinprick , position and vibratory sensation.  Coordination: Rapid alternating movements normal in all extremities. Finger-to-nose and heel-to-shin performed accurately bilaterally. Gait and Station: Arises from chair without difficulty.  Stance is normal. Gait demonstrates normal stride length and balance.  Able to tandem gait, toe raise on toes and go back on heels without difficulty.  Romberg negative. Reflexes: 1+ and symmetric. Toes downgoing.        ASSESSMENT: Henry Powell is a 40 y.o. 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 Vascular risk factors include HTN, HLD, tobacco use, alcohol abuse, vitamin B-12 deficiency on supplementation and family history of stroke. Repeat CTA showed resolution of prior dissection.  Recovered well from a stroke standpoint with only intermittent vertigo    PLAN:  1. Right cerebellar stroke:  a. Residual deficit:  b. Continue aspirin 81 mg daily  and atorvastatin 80 mg daily  for secondary stroke prevention.  c. HTN: BP goal < 130/90 d. HLD: LDL goal< 70 mg/dL.  Prior LDL 131 (02/2020) on atorvastatin 80 mg daily therefore recommend initiating Zetia 10 mg daily in addition.  2. Right VA dissection: Resolution of prior imaging -no need for surveillance monitoring or repeat imaging at this time 3. B12 deficiency: Continuation of supplementation 1000 mcg daily.  Repeat B12 level today 4. Reactive anxiety, post stroke: Resolved.  As he only intermittently takes Lexapro, advised to discontinue at this time and to call office with any worsening of symptoms.   Follow up in 6 months or call earlier if needed   I spent 30 minutes of face-to-face and non-face-to-face time with patient.  This included previsit chart review, lab review, study review, order entry, electronic health record documentation, patient education     Ihor Austin, [facility]  [facility] [address] [place], [place] [address]  Phone [phone] Fax [phone] Note: This document was prepared with digital dictation and possible smart phrase technology. Any  transcriptional errors that result from this process are unintentional.

## 2020-10-26 ENCOUNTER — Ambulatory Visit: Payer: 59 | Admitting: Dietician

## 2020-11-25 ENCOUNTER — Other Ambulatory Visit: Payer: Self-pay

## 2020-12-07 ENCOUNTER — Ambulatory Visit: Payer: 59 | Admitting: Dietician

## 2020-12-14 ENCOUNTER — Ambulatory Visit: Payer: Self-pay | Admitting: Adult Health

## 2020-12-17 ENCOUNTER — Ambulatory Visit: Payer: Self-pay

## 2020-12-17 ENCOUNTER — Other Ambulatory Visit: Payer: Self-pay

## 2020-12-17 VITALS — BP 128/96

## 2020-12-17 DIAGNOSIS — Z0000 Encounter for general adult medical examination without abnormal findings: Secondary | ICD-10-CM

## 2020-12-17 LAB — POCT URINALYSIS DIPSTICK
Bilirubin, UA: NEGATIVE
Blood, UA: POSITIVE
Glucose, UA: NEGATIVE
Ketones, UA: NEGATIVE
Leukocytes, UA: NEGATIVE
Nitrite, UA: NEGATIVE
Protein, UA: NEGATIVE
Spec Grav, UA: >=1.03 — AB (ref 1.010–1.025)
Urobilinogen, UA: 0.2 E.U./dL
pH, UA: 6 (ref 5.0–8.0)

## 2020-12-17 NOTE — Progress Notes (Signed)
Pt scheduled rhonda summers, PA-C. CL,RMA

## 2020-12-18 LAB — CMP12+LP+TP+TSH+6AC+CBC/D/PLT
ALT: 54 IU/L — ABNORMAL HIGH (ref 0–44)
AST: 34 IU/L (ref 0–40)
Albumin/Globulin Ratio: 2.1 (ref 1.2–2.2)
Albumin: 4.7 g/dL (ref 4.0–5.0)
Alkaline Phosphatase: 90 IU/L (ref 44–121)
BUN/Creatinine Ratio: 10 (ref 9–20)
BUN: 12 mg/dL (ref 6–20)
Basophils Absolute: 0 10*3/uL (ref 0.0–0.2)
Basos: 1 %
Bilirubin Total: 0.4 mg/dL (ref 0.0–1.2)
Calcium: 9.6 mg/dL (ref 8.7–10.2)
Chloride: 101 mmol/L (ref 96–106)
Chol/HDL Ratio: 3.8 ratio (ref 0.0–5.0)
Cholesterol, Total: 165 mg/dL (ref 100–199)
Creatinine, Ser: 1.22 mg/dL (ref 0.76–1.27)
EOS (ABSOLUTE): 0.2 10*3/uL (ref 0.0–0.4)
Eos: 3 %
Estimated CHD Risk: 0.7 times avg. (ref 0.0–1.0)
Free Thyroxine Index: 1.6 (ref 1.2–4.9)
GGT: 45 IU/L (ref 0–65)
Globulin, Total: 2.2 g/dL (ref 1.5–4.5)
Glucose: 107 mg/dL — ABNORMAL HIGH (ref 65–99)
HDL: 44 mg/dL (ref >39)
Hematocrit: 41.2 % (ref 37.5–51.0)
Hemoglobin: 14 g/dL (ref 13.0–17.7)
Immature Grans (Abs): 0 10*3/uL (ref 0.0–0.1)
Immature Granulocytes: 0 %
Iron: 84 ug/dL (ref 38–169)
LDH: 178 IU/L (ref 121–224)
LDL Chol Calc (NIH): 95 mg/dL (ref 0–99)
Lymphocytes Absolute: 1.9 10*3/uL (ref 0.7–3.1)
Lymphs: 33 %
MCH: 29.8 pg (ref 26.6–33.0)
MCHC: 34 g/dL (ref 31.5–35.7)
MCV: 88 fL (ref 79–97)
Monocytes Absolute: 0.5 10*3/uL (ref 0.1–0.9)
Monocytes: 8 %
Neutrophils Absolute: 3.3 10*3/uL (ref 1.4–7.0)
Neutrophils: 55 %
Phosphorus: 3.6 mg/dL (ref 2.8–4.1)
Platelets: 278 10*3/uL (ref 150–450)
Potassium: 4.5 mmol/L (ref 3.5–5.2)
RBC: 4.7 x10E6/uL (ref 4.14–5.80)
RDW: 13.8 % (ref 11.6–15.4)
Sodium: 139 mmol/L (ref 134–144)
T3 Uptake Ratio: 27 % (ref 24–39)
T4, Total: 6 ug/dL (ref 4.5–12.0)
TSH: 2.1 u[IU]/mL (ref 0.450–4.500)
Total Protein: 6.9 g/dL (ref 6.0–8.5)
Triglycerides: 145 mg/dL (ref 0–149)
Uric Acid: 6.2 mg/dL (ref 3.8–8.4)
VLDL Cholesterol Cal: 26 mg/dL (ref 5–40)
WBC: 5.9 10*3/uL (ref 3.4–10.8)
eGFR: 78 mL/min/{1.73_m2} (ref >59)

## 2020-12-24 ENCOUNTER — Ambulatory Visit: Payer: Self-pay | Admitting: Emergency Medicine

## 2020-12-24 ENCOUNTER — Encounter: Payer: Self-pay | Admitting: Emergency Medicine

## 2020-12-24 ENCOUNTER — Other Ambulatory Visit: Payer: Self-pay

## 2020-12-24 VITALS — BP 127/96 | HR 97 | Temp 97.8°F | Resp 14 | Ht 68.0 in | Wt 210.0 lb

## 2020-12-24 DIAGNOSIS — Z0000 Encounter for general adult medical examination without abnormal findings: Secondary | ICD-10-CM

## 2020-12-24 NOTE — Progress Notes (Signed)
I have reviewed the triage vital signs and the nursing notes.   HISTORY  Chief Complaint Annual Exam  HPI Henry Powell is a 40 y.o. male is here for annual physical. No complaints.        Past Medical History:  Diagnosis Date  . Anxiety state 06/11/2020  . Hypertension   . Stroke [facility])     Patient Active Problem List   Diagnosis Date Noted  . Anxiety state 06/11/2020  . 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 Cerebral edema (HCC) 08/26/2019  . Hyperlipidemia 08/26/2019  . B12 deficiency 08/26/2019  . Family hx-stroke 08/26/2019  . 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 Cerebellar stroke, acute (HCC)   . Hypertensive urgency   . Alcohol abuse     Past Surgical History:  Procedure Laterality Date  . NO PAST SURGERIES      Prior to Admission medications   Medication Sig Start Date End Date Taking? Authorizing Provider  aspirin 81 MG chewable tablet Chew by mouth daily.   Yes [provider]  cloNIDine (CATAPRES) 0.1 MG tablet Take 1 tablet (0.1 mg total) by mouth 3 (three) times daily. 08/31/20  Yes Joni Reining, PA-C  cyanocobalamin 1000 MCG tablet Take 1 tablet (1,000 mcg total) by mouth daily. 11/14/19  Yes McCue, Shanda Bumps, NP  ezetimibe (ZETIA) 10 MG tablet Take 1 tablet (10 mg total) by mouth daily. 02/26/20  Yes McCue, Shanda Bumps, NP  omega-3 acid ethyl esters (LOVAZA) 1 g capsule Take 2 capsules (2 g total) by mouth 2 (two) times daily. 10/15/20 11/14/20 Yes Pricilla Handler, MD  orphenadrine (NORFLEX) 100 MG tablet Take 1 tablet (100 mg total) by mouth 2 (two) times daily. 07/06/20  Yes Joni Reining, PA-C  rosuvastatin (CRESTOR) 40 MG tablet Take 1 tablet (40 mg total) by mouth daily. 10/15/20  Yes Pricilla Handler, MD  sertraline (ZOLOFT) 50 MG tablet Take 1 tablet (50 mg total) by mouth daily. 08/25/20  Yes Viviano Simas, FNP    Allergies Patient has no known allergies.  Family History  Problem Relation  Age of Onset  . Hypertension Mother   . Diabetes Mother   . Hypertension Father   . Stroke Paternal Uncle   . Stroke Paternal Uncle     Social History Social History   Tobacco Use  . Smoking status: Never Smoker  . Smokeless tobacco: Former Neurosurgeon    Types: Chew  Substance Use Topics  . Alcohol use: Yes    Alcohol/week: 56.0 standard drinks    Types: 56 Cans of beer per week  . Drug use: Never    Review of Systems Constitutional: No fever/chills Eyes: No visual changes. ENT: No sore throat. Cardiovascular: Denies chest pain. Respiratory: Denies shortness of breath. Gastrointestinal: No abdominal pain.  No nausea, no vomiting.   Genitourinary: Negative for dysuria. Musculoskeletal: Negative for musculoskeletal pain. Skin: Negative for rash. Neurological: Negative for headaches, focal weakness or numbness.  ____________________________________________   PHYSICAL EXAM: Constitutional: Alert and oriented. Well appearing and in no acute distress. Eyes: Conjunctivae are normal. PERRL. EOMI. Head: Atraumatic. Nose: No congestion/rhinnorhea. Mouth/Throat: Mucous membranes are moist.   Neck: No stridor.  No cervical tenderness on palpation posteriorly.  Cardiovascular: Normal rate, regular rhythm. Grossly normal heart sounds.  Good peripheral circulation. Respiratory: Normal respiratory effort.  No retractions. Lungs CTAB. Gastrointestinal: Soft and nontender. No distention.  Bowel sounds are normoactive x4 quadrants. Musculoskeletal: Nontender thoracic or lumbar spine.  Patient is able move upper and lower extremities without any difficulty.  No edema noted lower extremities.  Patient is able to ambulate without any assistance. Neurologic:  Normal speech and language. No gross focal neurologic deficits are appreciated. No gait instability. Skin:  Skin is warm, dry and intact. No rash noted. Psychiatric: Mood and affect are normal. Speech and behavior are  normal.  ____________________________________________   LABS (all labs ordered are listed, but only abnormal results are displayed)  Labs were discussed ____________________________________________  ___________________________________________ FINAL CLINICAL IMPRESSION(S)  Normal annual exam   ED Discharge Orders    None      *Please note:  @EDCOVIDEVAL @  Some ED evaluations and interventions may be delayed as a result of limited staffing during and the pandemic.*   Note:  This document was prepared using Dragon voice recognition software and may include unintentional dictation errors.

## 2021-01-13 ENCOUNTER — Other Ambulatory Visit: Payer: Self-pay

## 2021-01-13 DIAGNOSIS — E7801 Familial hypercholesterolemia: Secondary | ICD-10-CM

## 2021-01-18 MED ORDER — CLONIDINE HCL 0.1 MG PO TABS: 0.1000 mg | ORAL_TABLET | Freq: Three times a day (TID) | ORAL | 3 refills | Status: DC

## 2021-01-18 MED ORDER — ROSUVASTATIN CALCIUM 40 MG PO TABS: 40.0000 mg | ORAL_TABLET | Freq: Every day | ORAL | 3 refills | Status: DC

## 2021-01-20 ENCOUNTER — Other Ambulatory Visit: Payer: Self-pay

## 2021-01-20 DIAGNOSIS — Z0283 Encounter for blood-alcohol and blood-drug test: Secondary | ICD-10-CM

## 2021-01-20 NOTE — Progress Notes (Signed)
[facility] Acct #:  [id number] [facility] Specimen #:  [id number]  AMD

## 2021-02-09 ENCOUNTER — Other Ambulatory Visit: Payer: Self-pay

## 2021-02-09 MED ORDER — EZETIMIBE 10 MG PO TABS: 10.0000 mg | ORAL_TABLET | Freq: Every day | ORAL | 0 refills | Status: DC

## 2021-02-17 ENCOUNTER — Ambulatory Visit: Payer: Self-pay

## 2021-02-18 ENCOUNTER — Other Ambulatory Visit: Payer: Self-pay

## 2021-02-18 ENCOUNTER — Ambulatory Visit: Payer: Self-pay | Admitting: Physician Assistant

## 2021-02-18 ENCOUNTER — Encounter: Payer: Self-pay | Admitting: Physician Assistant

## 2021-02-18 VITALS — BP 140/104 | HR 80 | Temp 98.2°F | Resp 12 | Ht 68.0 in | Wt 218.0 lb

## 2021-02-18 DIAGNOSIS — I2510 Atherosclerotic heart disease of native coronary artery without angina pectoris: Secondary | ICD-10-CM

## 2021-02-18 NOTE — Addendum Note (Signed)
Addended by: Gardner Candle on: 02/18/2021 02:07 PM   Modules accepted: Orders

## 2021-02-18 NOTE — Progress Notes (Signed)
Electronic BP = 141/115 Manual BP Recheck     According to paper chart Pt received TDAP 2015 Pt denies covid vaccine. Pt aware of hep C screening agrees to have it.  AMD

## 2021-02-18 NOTE — Progress Notes (Signed)
   Subjective: Hypertension    Patient ID: Henry Powell, male    DOB: Jan 13, 1981, 40 y.o.   MRN: [id number]  HPI Patient presents for reevaluation hypertension medication.  Patient states diastolic blood pressure continues to be high.  Earlier this year patient was discontinued from norvasc  and started on Catapres by the clinics previous PCP Dr. Pricilla Handler.  Patient states there has been confusion because he was told to take Catapres 3 times daily.  Prescriptions bottle stated 2 times a day.  Patient states home monitoring has not shown a change in the low number readings.  Patient denies any side effects with medication changes.  Medication change was initiated after review of patient history showing Cerebellar non extravasating CVA.  Patient has become very anxious in the past few weeks with the medication change.     Objective:   Physical Exam No acute distress.  Appears anxious.   Temperature 98.2, pulse 80, respiration 12, BP is 140/104, and patient is 97% O2 sat on room air.  Patient weighs 218 pounds, BMI is 33.1. HEENT is unremarkable.  Neck is supple for adenopathy or bruits.  Lungs clear to auscultation.  Heart regular rate and rhythm. Abdomen slightly distended secondary to body habitus.  Normoactive bowel sounds, soft, nontender to palpation.       Assessment & Plan: Hypertension   Advised patient to continue previous medication pending consult to cardiologist.

## 2021-02-22 ENCOUNTER — Other Ambulatory Visit: Payer: Self-pay

## 2021-02-22 ENCOUNTER — Ambulatory Visit (INDEPENDENT_AMBULATORY_CARE_PROVIDER_SITE_OTHER): Payer: 59 | Admitting: Cardiology

## 2021-02-22 ENCOUNTER — Encounter: Payer: Self-pay | Admitting: Cardiology

## 2021-02-22 VITALS — BP 124/100 | HR 81 | Ht 68.0 in | Wt 218.0 lb

## 2021-02-22 DIAGNOSIS — I10 Essential (primary) hypertension: Secondary | ICD-10-CM | POA: Diagnosis not present

## 2021-02-22 DIAGNOSIS — I639 Cerebral infarction, unspecified: Secondary | ICD-10-CM

## 2021-02-22 MED ORDER — IRBESARTAN 150 MG PO TABS: 150.0000 mg | ORAL_TABLET | Freq: Every day | ORAL | 5 refills | Status: DC

## 2021-02-22 NOTE — Progress Notes (Signed)
Cardiology Office Note:    Date:  02/22/2021   ID:  Henry Powell, DOB 04/08/1981, MRN [id number]  PCP:  Patient, No Pcp Per (Inactive)   [facility] Providers Cardiologist:  None     Referring MD: Joni Reining, PA-C   Chief Complaint  Patient presents with   New Patient (Initial Visit)    Referred By Pcp for ASHD - Meds reviewed verbally with patient.    Henry Powell is a 40 y.o. male who is being seen today for the evaluation of hypertension at the request of Wells Guiles.   History of Present Illness:    Henry Powell is a 40 y.o. male with a hx of hypertension, CVA 2020, EtOH use, who presents due to difficult to control blood pressures.  Patient states having elevated blood pressures for years.  Was started on clonidine 3 times daily but diastolic blood pressure still elevated.  He had a stroke in 2020, had dizziness at the time, head CT showed right PICA infarct.  Echocardiogram 08/22/2019 showed normal systolic function, EF 55 to 60%, impaired relaxation.  He drinks about 3 cans of beer daily, endorses eating a low-salt diet.  He otherwise feels well, denies chest pain, shortness of breath, dizziness.  Past Medical History:  Diagnosis Date   Anxiety state 06/11/2020   Hypertension    Stroke [facility])     Past Surgical History:  Procedure Laterality Date   NO PAST SURGERIES      Current Medications: Current Meds  Medication Sig   aspirin 81 MG chewable tablet Chew by mouth daily.   cyanocobalamin 1000 MCG tablet Take 1 tablet (1,000 mcg total) by mouth daily.   irbesartan (AVAPRO) 150 MG tablet Take 1 tablet (150 mg total) by mouth daily.   omega-3 acid ethyl esters (LOVAZA) 1 g capsule Take 2 capsules (2 g total) by mouth 2 (two) times daily.   rosuvastatin (CRESTOR) 40 MG tablet Take 1 tablet (40 mg total) by mouth daily.   sertraline (ZOLOFT) 50 MG tablet Take 1 tablet (50 mg total) by mouth daily.   [DISCONTINUED] cloNIDine (CATAPRES) 0.1 MG  tablet Take 1 tablet (0.1 mg total) by mouth 3 (three) times daily.     Allergies:   Patient has no known allergies.   Social History   Socioeconomic History   Marital status: Divorced    Spouse name: Not on file   Number of children: Not on file   Years of education: Not on file   Highest education level: Not on file  Occupational History   Not on file  Tobacco Use   Smoking status: Never   Smokeless tobacco: Former    Types: Chew    Quit date: 08/18/2019  Substance and Sexual Activity   Alcohol use: Yes    Alcohol/week: 56.0 standard drinks    Types: 56 Cans of beer per week   Drug use: Never   Sexual activity: Not on file  Other Topics Concern   Not on file  Social History Narrative   Mr. Zeidman works, please call after 4:00 pm.   Social Determinants of Health   Financial Resource Strain: Not on file  Food Insecurity: Not on file  Transportation Needs: Not on file  Physical Activity: Not on file  Stress: Not on file  Social Connections: Not on file     Family History: The patient's family history includes Diabetes in his mother; Hypertension in his father and mother; Stroke in his paternal uncle  and paternal uncle.  ROS:   Please see the history of present illness.     All other systems reviewed and are negative.  EKGs/Labs/Other Studies Reviewed:    The following studies were reviewed today:   EKG:  EKG is  ordered today.  The ekg ordered today demonstrates normal sinus rhythm  Recent Labs: 12/17/2020: ALT 54; BUN 12; Creatinine, Ser 1.22; Hemoglobin 14.0; Platelets 278; Potassium 4.5; Sodium 139; TSH 2.100  Recent Lipid Panel    Component Value Date/Time   CHOL 165 12/17/2020 0830   TRIG 145 12/17/2020 0830   HDL 44 12/17/2020 0830   CHOLHDL 3.8 12/17/2020 0830   CHOLHDL 7.0 08/22/2019 0541   VLDL 31 08/22/2019 0541   LDLCALC 95 12/17/2020 0830     Risk Assessment/Calculations:      Physical Exam:    VS:  BP (!) 124/100 (BP Location: Left  Arm, Patient Position: Sitting, Cuff Size: Normal)   Pulse 81   Ht 5\' 8"  (1.727 m)   Wt 218 lb (98.9 kg)   SpO2 97%   BMI 33.15 kg/m     Wt Readings from Last 3 Encounters:  02/22/21 218 lb (98.9 kg)  02/18/21 218 lb (98.9 kg)  12/24/20 210 lb (95.3 kg)     GEN:  Well nourished, well developed in no acute distress HEENT: Normal NECK: No JVD; No carotid bruits LYMPHATICS: No lymphadenopathy CARDIAC: RRR, no murmurs, rubs, gallops RESPIRATORY:  Clear to auscultation without rales, wheezing or rhonchi  ABDOMEN: Soft, non-tender, non-distended MUSCULOSKELETAL:  No edema; No deformity  SKIN: Warm and dry NEUROLOGIC:  Alert and oriented x 3 PSYCHIATRIC:  Normal affect   ASSESSMENT:    1. Primary hypertension   2. Cerebrovascular accident (CVA), unspecified mechanism (HCC)    PLAN:    In order of problems listed above:  Hypertension, diastolic BP still elevated.  Start irbesartan 150 mg daily.  Titrate off clonidine over the next several weeks.  Advised to cut back on alcohol use as this might be contributing to elevated BP.  Plan to titrate irbesartan as needed to control BP. History of CVA, aspirin, Crestor continued.  Follow-up in 5 weeks for BP check      Medication Adjustments/Labs and Tests Ordered: Current medicines are reviewed at length with the patient today.  Concerns regarding medicines are outlined above.  Orders Placed This Encounter  Procedures   EKG 12-Lead    Meds ordered this encounter  Medications   irbesartan (AVAPRO) 150 MG tablet    Sig: Take 1 tablet (150 mg total) by mouth daily.    Dispense:  30 tablet    Refill:  5     Patient Instructions  Medication Instructions:   Your physician has recommended you make the following change in your medication:    START taking Irbesartan 150 MG daily.  Taper your Clonidine down:         - take 2X a day for 1 week.        - take once a day for 1 week.        - take every other day for 1 week.         - then stop.  *If you need a refill on your cardiac medications before your next appointment, please call your pharmacy*   Lab Work: Noen ordered If you have labs (blood work) drawn today and your tests are completely normal, you will receive your results only by: MyChart Message (if you have  MyChart) OR A paper copy in the mail If you have any lab test that is abnormal or we need to change your treatment, we will call you to review the results.   Testing/Procedures: None ordered   Follow-Up: At [facility], you and your health needs are our priority.  As part of our continuing mission to provide you with exceptional heart care, we have created designated Provider Care Teams.  These Care Teams include your primary Cardiologist (physician) and Advanced Practice Providers (APPs -  Physician Assistants and Nurse Practitioners) who all work together to provide you with the care you need, when you need it.  We recommend signing up for the patient portal called "MyChart".  Sign up information is provided on this After Visit Summary.  MyChart is used to connect with patients for Virtual Visits (Telemedicine).  Patients are able to view lab/test results, encounter notes, upcoming appointments, etc.  Non-urgent messages can be sent to your provider as well.   To learn more about what you can do with MyChart, go to [email or web address].    Your next appointment:   5 week(s)  The format for your next appointment:   In Person  Provider:   Debbe Odea, MD   Other Instructions    Signed, Debbe Odea, MD  02/22/2021 12:22 PM    [facility]

## 2021-02-22 NOTE — Patient Instructions (Signed)
Medication Instructions:   Your physician has recommended you make the following change in your medication:    START taking Irbesartan 150 MG daily.  Taper your Clonidine down:         - take 2X a day for 1 week.        - take once a day for 1 week.        - take every other day for 1 week.        - then stop.  *If you need a refill on your cardiac medications before your next appointment, please call your pharmacy*   Lab Work: Noen ordered If you have labs (blood work) drawn today and your tests are completely normal, you will receive your results only by: MyChart Message (if you have MyChart) OR A paper copy in the mail If you have any lab test that is abnormal or we need to change your treatment, we will call you to review the results.   Testing/Procedures: None ordered   Follow-Up: At [facility], you and your health needs are our priority.  As part of our continuing mission to provide you with exceptional heart care, we have created designated Provider Care Teams.  These Care Teams include your primary Cardiologist (physician) and Advanced Practice Providers (APPs -  Physician Assistants and Nurse Practitioners) who all work together to provide you with the care you need, when you need it.  We recommend signing up for the patient portal called "MyChart".  Sign up information is provided on this After Visit Summary.  MyChart is used to connect with patients for Virtual Visits (Telemedicine).  Patients are able to view lab/test results, encounter notes, upcoming appointments, etc.  Non-urgent messages can be sent to your provider as well.   To learn more about what you can do with MyChart, go to [email or web address].    Your next appointment:   5 week(s)  The format for your next appointment:   In Person  Provider:   Debbe Odea, MD   Other Instructions

## 2021-02-23 ENCOUNTER — Ambulatory Visit: Payer: Self-pay

## 2021-02-23 VITALS — BP 136/101 | HR 79

## 2021-02-23 DIAGNOSIS — Z0130 Encounter for examination of blood pressure without abnormal findings: Secondary | ICD-10-CM

## 2021-02-23 NOTE — Progress Notes (Signed)
Pt presents today to have his BP checked. Pt states he's recorded 2 readings  02/22/2021 : 5:45pm 142/96  pulse 80  2.  02/23/2021: 5:57am  157/107  pulse 68

## 2021-02-24 ENCOUNTER — Other Ambulatory Visit: Payer: Self-pay

## 2021-02-24 ENCOUNTER — Ambulatory Visit: Payer: Self-pay

## 2021-02-24 VITALS — BP 123/88 | HR 93

## 2021-02-24 DIAGNOSIS — Z0130 Encounter for examination of blood pressure without abnormal findings: Secondary | ICD-10-CM

## 2021-02-24 NOTE — Progress Notes (Signed)
Pt presents today for bp check. First reading this morning 5:30am with out medications was 161/103. Pt 2nd reading in office 117/78. CL,RMA

## 2021-02-25 ENCOUNTER — Ambulatory Visit: Payer: Self-pay

## 2021-02-25 VITALS — BP 130/81 | HR 95

## 2021-02-25 DIAGNOSIS — Z0130 Encounter for examination of blood pressure without abnormal findings: Secondary | ICD-10-CM

## 2021-02-25 NOTE — Progress Notes (Signed)
02/24/21 9:35 pm  BP = 123/65  P = 70 02/25/21 5:50 am  BP = 144/104  P = 71  AMD

## 2021-02-26 ENCOUNTER — Ambulatory Visit: Payer: Self-pay

## 2021-02-26 ENCOUNTER — Other Ambulatory Visit: Payer: Self-pay

## 2021-02-26 VITALS — BP 124/90 | HR 88

## 2021-02-26 DIAGNOSIS — Z0130 Encounter for examination of blood pressure without abnormal findings: Secondary | ICD-10-CM

## 2021-02-26 NOTE — Progress Notes (Signed)
Pt presents today for bpi check. Pt did not bring reading with him this visit. BP slightly elevated.  Last reading at 124/90. CL,RMA

## 2021-03-01 ENCOUNTER — Other Ambulatory Visit: Payer: Self-pay

## 2021-03-01 ENCOUNTER — Ambulatory Visit: Payer: Self-pay

## 2021-03-01 ENCOUNTER — Telehealth: Payer: Self-pay | Admitting: Cardiology

## 2021-03-01 VITALS — BP 133/91 | HR 82

## 2021-03-01 DIAGNOSIS — Z0130 Encounter for examination of blood pressure without abnormal findings: Secondary | ICD-10-CM

## 2021-03-01 NOTE — Telephone Encounter (Signed)
Patient calling to update  States that he has some BP readings for review on mychart  Please call to discuss

## 2021-03-01 NOTE — Progress Notes (Signed)
Pt presents today for Blood pressure check. Pt did not bring readings with him today.

## 2021-03-03 ENCOUNTER — Other Ambulatory Visit: Payer: Self-pay

## 2021-03-03 ENCOUNTER — Ambulatory Visit: Payer: Self-pay

## 2021-03-03 VITALS — BP 111/86 | HR 94 | Resp 12

## 2021-03-03 DIAGNOSIS — Z0130 Encounter for examination of blood pressure without abnormal findings: Secondary | ICD-10-CM

## 2021-03-03 NOTE — Progress Notes (Signed)
Presents to [facility] for blood pressure check.  States since Monday he's down to Clonodine 1 tab po qd & next week he weans to every other day.  States he feels good today.  AMD

## 2021-03-22 ENCOUNTER — Other Ambulatory Visit: Payer: Self-pay

## 2021-03-22 DIAGNOSIS — F411 Generalized anxiety disorder: Secondary | ICD-10-CM

## 2021-03-22 MED ORDER — SERTRALINE HCL 50 MG PO TABS: 50.0000 mg | ORAL_TABLET | Freq: Every day | ORAL | 3 refills | Status: DC

## 2021-03-29 ENCOUNTER — Ambulatory Visit: Payer: 59 | Admitting: Cardiology

## 2021-04-13 NOTE — Progress Notes (Signed)
Cardiology Office Note:    Date:  04/14/2021   ID:  Henry Powell, DOB September 03, 1981, MRN [id number]  PCP:  Patient, No Pcp Per (Inactive)  [facility] Cardiologist:  None  [facility] Electrophysiologist:  None   Referring MD: No ref. provider found   Chief Complaint: 5 week follow-up  History of Present Illness:    Henry Powell is a 40 y.o. male with a hx of HTN, CVA 2020, ETOH use who presents for follow-up for HTN.  Patinet has a h/o of HTN started on clonidine TID but diastolic pressures were still high. He had a stroke in 2020, CT head showed right PICA infarct. Echo 08/2019 showed normal systolic function. EF 50-60%, impaired relaxation.   Last seen 02/22/21 and BP was 124/100. He was started on irbesartan 150mg  daily with the plan to titrate off clonidine. Alcohol decrease encouraged.   Today, the patient brought a BP log. Diastolic's were better when on both clonidine and irbesartan. He is now weaned of clonidine and today bp 124/100. No chest pain, sob, palpitations, LLE, orthopnea. He is feeling well.  Tries to eat low salt diet. Eats fast food 1-2 times a week. Cooks at home, uses a lot of salt. 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 Alcohol use at least 8 drinks a day.    Past Medical History:  Diagnosis Date   Anxiety state 06/11/2020   Hypertension    Stroke [facility])     Past Surgical History:  Procedure Laterality Date   NO PAST SURGERIES      Current Medications: Current Meds  Medication Sig   aspirin 81 MG chewable tablet Chew by mouth daily.   cyanocobalamin 1000 MCG tablet Take 1 tablet (1,000 mcg total) by mouth daily.   omega-3 acid ethyl esters (LOVAZA) 1 g capsule Take 2 capsules (2 g total) by mouth 2 (two) times daily.   rosuvastatin (CRESTOR) 40 MG tablet Take 1 tablet (40 mg total) by mouth daily.   sertraline (ZOLOFT) 50 MG tablet Take 1 tablet (50 mg total) by mouth daily.   [DISCONTINUED] irbesartan (AVAPRO) 150 MG tablet Take 1 tablet (150  mg total) by mouth daily.     Allergies:   Patient has no known allergies.   Social History   Socioeconomic History   Marital status: Divorced    Spouse name: Not on file   Number of children: Not on file   Years of education: Not on file   Highest education level: Not on file  Occupational History   Not on file  Tobacco Use   Smoking status: Never   Smokeless tobacco: Former    Types: Chew    Quit date: 08/18/2019  Substance and Sexual Activity   Alcohol use: Yes    Alcohol/week: 56.0 standard drinks    Types: 56 Cans of beer per week   Drug use: Never   Sexual activity: Not on file  Other Topics Concern   Not on file  Social History Narrative   Henry Powell works, please call after 4:00 pm.   Social Determinants of Health   Financial Resource Strain: Not on file  Food Insecurity: Not on file  Transportation Needs: Not on file  Physical Activity: Not on file  Stress: Not on file  Social Connections: Not on file     Family History: The patient's family history includes Diabetes in his mother; Hypertension in his father and mother; Stroke in his paternal uncle and paternal uncle.  ROS:  Please see the history of present illness.     All other systems reviewed and are negative.  EKGs/Labs/Other Studies Reviewed:    The following studies were reviewed today:  Echo 08/2019  1. Left ventricular ejection fraction, by visual estimation, is 55 to  60%. The left ventricle has normal function. There is no left ventricular  hypertrophy.   2. Left ventricular diastolic parameters are consistent with Grade I  diastolic dysfunction (impaired relaxation).   3. The left ventricle has no regional wall motion abnormalities.   4. Global right ventricle has normal systolic function.The right  ventricular size is normal.   5. Left atrial size was normal.   6. Right atrial size was normal.   7. The mitral valve is normal in structure. No evidence of mitral valve   regurgitation. No evidence of mitral stenosis.   8. The tricuspid valve is normal in structure. Tricuspid valve  regurgitation is trivial.   9. The aortic valve is normal in structure. Aortic valve regurgitation is  not visualized. No evidence of aortic valve sclerosis or stenosis.  10. The pulmonic valve was normal in structure. Pulmonic valve  regurgitation is trivial.  11. The inferior vena cava is normal in size with greater than 50%  respiratory variability, suggesting right atrial pressure of 3 mmHg.  12. Normal LV systolic function; grade 1 diastolic dysfunction.   EKG:  EKG is not ordered today.   Recent Labs: 12/17/2020: ALT 54; BUN 12; Creatinine, Ser 1.22; Hemoglobin 14.0; Platelets 278; Potassium 4.5; Sodium 139; TSH 2.100  Recent Lipid Panel    Component Value Date/Time   CHOL 165 12/17/2020 0830   TRIG 145 12/17/2020 0830   HDL 44 12/17/2020 0830   CHOLHDL 3.8 12/17/2020 0830   CHOLHDL 7.0 08/22/2019 0541   VLDL 31 08/22/2019 0541   LDLCALC 95 12/17/2020 0830    Physical Exam:    VS:  BP (!) 128/100 (BP Location: Left Arm, Patient Position: Sitting, Cuff Size: Normal)   Pulse 81   Ht 5\' 8"  (1.727 m)   Wt 220 lb 8 oz (100 kg)   SpO2 98%   BMI 33.53 kg/m     Wt Readings from Last 3 Encounters:  04/14/21 220 lb 8 oz (100 kg)  02/22/21 218 lb (98.9 kg)  02/18/21 218 lb (98.9 kg)     GEN:  Well nourished, well developed in no acute distress HEENT: Normal NECK: No JVD; No carotid bruits LYMPHATICS: No lymphadenopathy CARDIAC: RRR, no murmurs, rubs, gallops RESPIRATORY:  Clear to auscultation without rales, wheezing or rhonchi  ABDOMEN: Soft, non-tender, non-distended MUSCULOSKELETAL:  No edema; No deformity  SKIN: Warm and dry NEUROLOGIC:  Alert and oriented x 3 PSYCHIATRIC:  Normal affect   ASSESSMENT:    1. Cerebrovascular accident (CVA), unspecified mechanism (HCC)   2. Primary hypertension   3. Alcohol use    PLAN:    In order of problems  listed above:  HTN Patient has been weaned off clonidine and is been taking Irbesartan 150mg  daily. BP today with elevated diastolic number. He is asymptomatic. Lifestyle changes discussed in detail today. Encouraged formal activity, healthy eating, and decreased alcohol intake. I will check a BMET today. Increase irbesartan to 300mg  daily. He will continue to check BP at home. We will see the patient back in 1-2 months. If DBP still high may need additional medication, however given he is young, expect lifestyle changes would greatly help.   H/o CVA Continue Aspirin and statin.  Alcohol use Patient is drinking at least 8 drinks daily. Discussed in depth alcohol cessation.   Disposition: Follow up  1-2 month  with MD/APP    Signed, Nakoa Ganus David Stall, PA-C  04/14/2021 9:22 AM    [facility]

## 2021-04-14 ENCOUNTER — Other Ambulatory Visit: Payer: Self-pay

## 2021-04-14 ENCOUNTER — Encounter: Payer: Self-pay | Admitting: *Deleted

## 2021-04-14 ENCOUNTER — Ambulatory Visit (INDEPENDENT_AMBULATORY_CARE_PROVIDER_SITE_OTHER): Payer: 59 | Admitting: Medical

## 2021-04-14 ENCOUNTER — Encounter: Payer: Self-pay | Admitting: Medical

## 2021-04-14 VITALS — BP 128/100 | HR 81 | Ht 68.0 in | Wt 220.5 lb

## 2021-04-14 DIAGNOSIS — I639 Cerebral infarction, unspecified: Secondary | ICD-10-CM | POA: Diagnosis not present

## 2021-04-14 DIAGNOSIS — F1090 Alcohol use, unspecified, uncomplicated: Secondary | ICD-10-CM

## 2021-04-14 DIAGNOSIS — Z7289 Other problems related to lifestyle: Secondary | ICD-10-CM

## 2021-04-14 DIAGNOSIS — I10 Essential (primary) hypertension: Secondary | ICD-10-CM | POA: Diagnosis not present

## 2021-04-14 DIAGNOSIS — Z789 Other specified health status: Secondary | ICD-10-CM

## 2021-04-14 MED ORDER — IRBESARTAN 150 MG PO TABS: 300.0000 mg | ORAL_TABLET | Freq: Every day | ORAL | 2 refills | Status: DC

## 2021-04-14 NOTE — Patient Instructions (Signed)
Medication Instructions:  Your physician has recommended you make the following change in your medication:   INCREASE Irbesartan 150 mg take 2 tablets (300 mg) once daily.  *If you need a refill on your cardiac medications before your next appointment, please call your pharmacy*   Lab Work: BMET today  If you have labs (blood work) drawn today and your tests are completely normal, you will receive your results only by: MyChart Message (if you have MyChart) OR A paper copy in the mail If you have any lab test that is abnormal or we need to change your treatment, we will call you to review the results.   Testing/Procedures: None   Follow-Up: At [facility], you and your health needs are our priority.  As part of our continuing mission to provide you with exceptional heart care, we have created designated Provider Care Teams.  These Care Teams include your primary Cardiologist (physician) and Advanced Practice Providers (APPs -  Physician Assistants and Nurse Practitioners) who all work together to provide you with the care you need, when you need it.  Your next appointment:   1-2 month(s)  The format for your next appointment:   In Person  Provider:   You may see Dr. Azucena Cecil or one of the following Advanced Practice Providers on your designated Care Team:   Nicolasa Ducking, NP Eula Listen, PA-C Marisue Ivan, PA-C Cadence Melbeta, [place]

## 2021-04-15 LAB — BASIC METABOLIC PANEL
BUN/Creatinine Ratio: 15 (ref 9–20)
BUN: 17 mg/dL (ref 6–20)
CO2: 24 mmol/L (ref 20–29)
Calcium: 10.2 mg/dL (ref 8.7–10.2)
Chloride: 98 mmol/L (ref 96–106)
Creatinine, Ser: 1.11 mg/dL (ref 0.76–1.27)
Glucose: 109 mg/dL — ABNORMAL HIGH (ref 65–99)
Potassium: 5.1 mmol/L (ref 3.5–5.2)
Sodium: 137 mmol/L (ref 134–144)
eGFR: 87 mL/min/{1.73_m2} (ref >59)

## 2021-05-21 ENCOUNTER — Encounter: Payer: Self-pay | Admitting: Medical

## 2021-05-21 ENCOUNTER — Ambulatory Visit (INDEPENDENT_AMBULATORY_CARE_PROVIDER_SITE_OTHER): Payer: 59 | Admitting: Medical

## 2021-05-21 ENCOUNTER — Other Ambulatory Visit: Payer: Self-pay

## 2021-05-21 VITALS — BP 138/86 | HR 77 | Ht 68.0 in | Wt 221.0 lb

## 2021-05-21 DIAGNOSIS — I10 Essential (primary) hypertension: Secondary | ICD-10-CM | POA: Diagnosis not present

## 2021-05-21 DIAGNOSIS — Z7289 Other problems related to lifestyle: Secondary | ICD-10-CM | POA: Diagnosis not present

## 2021-05-21 DIAGNOSIS — I639 Cerebral infarction, unspecified: Secondary | ICD-10-CM | POA: Diagnosis not present

## 2021-05-21 DIAGNOSIS — F1010 Alcohol abuse, uncomplicated: Secondary | ICD-10-CM

## 2021-05-21 DIAGNOSIS — E782 Mixed hyperlipidemia: Secondary | ICD-10-CM | POA: Diagnosis not present

## 2021-05-21 DIAGNOSIS — Z789 Other specified health status: Secondary | ICD-10-CM

## 2021-05-21 DIAGNOSIS — R69 Illness, unspecified: Secondary | ICD-10-CM | POA: Diagnosis not present

## 2021-05-21 NOTE — Patient Instructions (Signed)
Medication Instructions:  Your physician recommends that you continue on your current medications as directed. Please refer to the Current Medication list given to you today.  *If you need a refill on your cardiac medications before your next appointment, please call your pharmacy*   Lab Work: None ordered If you have labs (blood work) drawn today and your tests are completely normal, you will receive your results only by: MyChart Message (if you have MyChart) OR A paper copy in the mail If you have any lab test that is abnormal or we need to change your treatment, we will call you to review the results.   Testing/Procedures: None ordered   Follow-Up: At [facility], you and your health needs are our priority.  As part of our continuing mission to provide you with exceptional heart care, we have created designated Provider Care Teams.  These Care Teams include your primary Cardiologist (physician) and Advanced Practice Providers (APPs -  Physician Assistants and Nurse Practitioners) who all work together to provide you with the care you need, when you need it.  We recommend signing up for the patient portal called "MyChart".  Sign up information is provided on this After Visit Summary.  MyChart is used to connect with patients for Virtual Visits (Telemedicine).  Patients are able to view lab/test results, encounter notes, upcoming appointments, etc.  Non-urgent messages can be sent to your provider as well.   To learn more about what you can do with MyChart, go to [email or web address].    Your next appointment:   4 week(s)  The format for your next appointment:   In Person  Provider:   You may see Dr. Azucena Cecil or one of the following Advanced Practice Providers on your designated Care Team:   Nicolasa Ducking, NP Eula Listen, PA-C Marisue Ivan, PA-C Cadence Fransico Michael, [place]   Other Instructions N/A

## 2021-05-21 NOTE — Progress Notes (Signed)
Cardiology Office Note:    Date:  05/21/2021   ID:  Henry Powell, DOB 04-28-1981, MRN [id number]  PCP:  Patient, No Pcp Per (Inactive)  [facility] Cardiologist:  None  [facility] Electrophysiologist:  None   Referring MD: No ref. provider found   Chief Complaint: 1-2 month follow-up  History of Present Illness:    Henry Powell is a 40 y.o. male with a hx of HTN, HLD, CVA 2020, and ETOH use who presents for follow-up for HTN.   Patinet has a h/o of HTN started on clonidine TID but diastolic pressures were still high. He had a stroke in 2020, CT head showed right PICA infarct. Echo 08/2019 showed normal systolic function. EF 50-60%, impaired relaxation.    seen 02/22/21 and BP was 124/100. He was started on irbesartan 150mg  daily with the plan to titrate off clonidine. Alcohol decrease encouraged.   Patient last seen 04/14/21 and was Bps were much better. Irbesartan was increased to 300mg  daily.   Today, the patient reports bps at home 120-130/80-90s. Today it's a little high 138/86. He has been walking more, around 2 miles a day. He has decreased alcohol to 3 drinks a day, used to be a lot more.  No chest pain or sob. No LLE, pnd, orthopnea. Discussed other options for better bp control, he would like to pursue lifestyle changes.    Past Medical History:  Diagnosis Date   Anxiety state 06/11/2020   Hypertension    Stroke [facility])     Past Surgical History:  Procedure Laterality Date   NO PAST SURGERIES      Current Medications: Current Meds  Medication Sig   aspirin 81 MG chewable tablet Chew by mouth daily.   cyanocobalamin 1000 MCG tablet Take 1 tablet (1,000 mcg total) by mouth daily.   irbesartan (AVAPRO) 150 MG tablet Take 2 tablets (300 mg total) by mouth daily.   omega-3 acid ethyl esters (LOVAZA) 1 g capsule Take 1 g by mouth daily.   rosuvastatin (CRESTOR) 40 MG tablet Take 1 tablet (40 mg total) by mouth daily.   sertraline (ZOLOFT) 50 MG tablet Take 1 tablet  (50 mg total) by mouth daily.     Allergies:   Patient has no known allergies.   Social History   Socioeconomic History   Marital status: Divorced    Spouse name: Not on file   Number of children: Not on file   Years of education: Not on file   Highest education level: Not on file  Occupational History   Not on file  Tobacco Use   Smoking status: Never   Smokeless tobacco: Former    Types: Chew    Quit date: 08/18/2019  Substance and Sexual Activity   Alcohol use: Yes    Alcohol/week: 56.0 standard drinks    Types: 56 Cans of beer per week   Drug use: Never   Sexual activity: Not on file  Other Topics Concern   Not on file  Social History Narrative   Mr. Ende works, please call after 4:00 pm.   Social Determinants of Health   Financial Resource Strain: Not on file  Food Insecurity: Not on file  Transportation Needs: Not on file  Physical Activity: Not on file  Stress: Not on file  Social Connections: Not on file     Family History: The patient's family history includes Diabetes in his mother; Hypertension in his father and mother; Stroke in his paternal uncle and paternal uncle.  ROS:   Please see the history of present illness.     All other systems reviewed and are negative.  EKGs/Labs/Other Studies Reviewed:    The following studies were reviewed today:  Echo 08/2019  1. Left ventricular ejection fraction, by visual estimation, is 55 to  60%. The left ventricle has normal function. There is no left ventricular  hypertrophy.   2. Left ventricular diastolic parameters are consistent with Grade I  diastolic dysfunction (impaired relaxation).   3. The left ventricle has no regional wall motion abnormalities.   4. Global right ventricle has normal systolic function.The right  ventricular size is normal.   5. Left atrial size was normal.   6. Right atrial size was normal.   7. The mitral valve is normal in structure. No evidence of mitral valve   regurgitation. No evidence of mitral stenosis.   8. The tricuspid valve is normal in structure. Tricuspid valve  regurgitation is trivial.   9. The aortic valve is normal in structure. Aortic valve regurgitation is  not visualized. No evidence of aortic valve sclerosis or stenosis.  10. The pulmonic valve was normal in structure. Pulmonic valve  regurgitation is trivial.  11. The inferior vena cava is normal in size with greater than 50%  respiratory variability, suggesting right atrial pressure of 3 mmHg.  12. Normal LV systolic function; grade 1 diastolic dysfunction.     EKG:  EKG is ordered today.  The ekg ordered today demonstrates NSR, 77bpm, nonspecific T wave abnormality  Recent Labs: 12/17/2020: ALT 54; Hemoglobin 14.0; Platelets 278; TSH 2.100 04/14/2021: BUN 17; Creatinine, Ser 1.11; Potassium 5.1; Sodium 137  Recent Lipid Panel    Component Value Date/Time   CHOL 165 12/17/2020 0830   TRIG 145 12/17/2020 0830   HDL 44 12/17/2020 0830   CHOLHDL 3.8 12/17/2020 0830   CHOLHDL 7.0 08/22/2019 0541   VLDL 31 08/22/2019 0541   LDLCALC 95 12/17/2020 0830     Physical Exam:    VS:  BP 138/86 (BP Location: Left Arm, Patient Position: Sitting, Cuff Size: Normal)   Pulse 77   Ht 5\' 8"  (1.727 m)   Wt 221 lb (100.2 kg)   BMI 33.60 kg/m     Wt Readings from Last 3 Encounters:  05/21/21 221 lb (100.2 kg)  04/14/21 220 lb 8 oz (100 kg)  02/22/21 218 lb (98.9 kg)     GEN:  Well nourished, well developed in no acute distress HEENT: Normal NECK: No JVD; No carotid bruits LYMPHATICS: No lymphadenopathy CARDIAC: RRR, no murmurs, rubs, gallops RESPIRATORY:  Clear to auscultation without rales, wheezing or rhonchi  ABDOMEN: Soft, non-tender, non-distended MUSCULOSKELETAL:  No edema; No deformity  SKIN: Warm and dry NEUROLOGIC:  Alert and oriented x 3 PSYCHIATRIC:  Normal affect   ASSESSMENT:    1. Essential hypertension   2. Hyperlipidemia, mixed   3. Cerebrovascular  accident (CVA), unspecified mechanism (HCC)   4. Alcohol use   5. Alcohol abuse    PLAN:    In order of problems listed above:  HTN BP still mildly elevated today. He is off clonidine, and on Irbesartan 300mg  daily. Discussed addition of another medication for better bp control. Patient has made lifestyle changes with diet changes exercise (walking 2 miles daily). He would like to continue with lifestyle changes before addition of another medication. We will see him back in 1 month.   HLD LDL 95 in 12/2018 with goal less than 70. Continue statin and Lovaza.  We can re-check lipid profile/LFTs at follow-up.   Alcohol use He has decreased alcohol to 3 drinks a day for which he was congratulated for. Encouraged to further decrease alcohol intake.   H/o CVA Continue Aspirin and statin.   Disposition: Follow up in 1 month(s) with MD/APP    Signed, Airrion Otting David Stall, PA-C  05/21/2021 9:26 AM    [facility]

## 2021-06-03 ENCOUNTER — Ambulatory Visit: Payer: Self-pay | Admitting: Physician Assistant

## 2021-06-03 ENCOUNTER — Other Ambulatory Visit: Payer: Self-pay

## 2021-06-03 ENCOUNTER — Encounter: Payer: Self-pay | Admitting: Physician Assistant

## 2021-06-03 ENCOUNTER — Telehealth: Payer: Self-pay | Admitting: Cardiology

## 2021-06-03 VITALS — BP 134/98 | HR 83 | Temp 97.8°F | Resp 14 | Ht 68.0 in | Wt 215.0 lb

## 2021-06-03 DIAGNOSIS — M26621 Arthralgia of right temporomandibular joint: Secondary | ICD-10-CM

## 2021-06-03 MED ORDER — ORPHENADRINE CITRATE ER 100 MG PO TB12: 100.0000 mg | ORAL_TABLET | Freq: Two times a day (BID) | ORAL | 0 refills | Status: DC

## 2021-06-03 MED ORDER — NAPROXEN 500 MG PO TABS: 500.0000 mg | ORAL_TABLET | Freq: Two times a day (BID) | ORAL | Status: DC

## 2021-06-03 NOTE — Telephone Encounter (Signed)
Message fwd to Cadence Dunbar, [place] to advise

## 2021-06-03 NOTE — Progress Notes (Signed)
   Subjective: Right jaw pain    Patient ID: Henry Powell, male    DOB: 1980/10/16, 40 y.o.   MRN: [id number]  HPI Patient presents with right jaw pain for 2 days.  Patient stated pain is located in front of his right ear.  Patient states pain seems to be worse with a.m. awakening.  Patient gives a history of grinding his teeth.  No other provocative measures for complaint.   Review of Systems Anxiety, hyperlipidemia, and hypertension.    Objective:   Physical Exam No obvious deformity.  Palpable clicking with movement of the right TMJ.  Left TMJ unremarkable.  Examination bilateral ears unremarkable.       Assessment & Plan: TMJ syndrome   Patient given education discharge care instructions for complaint.  Patient is amenable to a trial of anti-inflammatory medications and muscle relaxants.  Patient will follow-up in 2 weeks.

## 2021-06-03 NOTE — Telephone Encounter (Signed)
Patient calling  Patient was told by Dr Katrinka Blazing to stop baby aspirin and start naproxen tomorrow morning for TMJ Patient was told by pharmacist to contact cardio before  Please call to discuss

## 2021-06-07 NOTE — Telephone Encounter (Signed)
I called and spoke with the patient regarding Cadence's recommendations to follow up with his PCP/ even neurology if he is currently seeing one, in regards to stopping the ASA.  The patient voices understanding and is agreeable.

## 2021-06-07 NOTE — Telephone Encounter (Signed)
Henry Sofia, PA-C  Sent: Fri June 04, 2021  2:17 PM  To: Jarvis Newcomer, RN; P [facility] Triage          Message  Appears pt is on Aspirin for h/o CVA, not a cardiac issues. Would defer to PCP.

## 2021-06-11 ENCOUNTER — Other Ambulatory Visit: Payer: Self-pay

## 2021-06-14 ENCOUNTER — Encounter: Payer: Self-pay | Admitting: Physician Assistant

## 2021-06-14 ENCOUNTER — Other Ambulatory Visit: Payer: Self-pay

## 2021-06-14 ENCOUNTER — Ambulatory Visit: Payer: Self-pay | Admitting: Physician Assistant

## 2021-06-14 VITALS — BP 137/98 | HR 104 | Temp 97.6°F | Resp 14 | Ht 68.0 in | Wt 210.0 lb

## 2021-06-14 DIAGNOSIS — M26621 Arthralgia of right temporomandibular joint: Secondary | ICD-10-CM

## 2021-06-14 NOTE — Progress Notes (Signed)
   Subjective: Right jaw pain    Patient ID: Henry Powell, male    DOB: 01-08-1981, 40 y.o.   MRN: [id number]  HPI Patient follow-up for right jaw pain.  Patient was seen in this facility last week for complaint.  Patient was placed on muscle relaxer and anti-inflammatory medication and stated there is improvement but the condition still persists.   Review of Systems Anxiety and hyperlipidemia.    Objective:   Physical Exam No acute distress.  Temperature 97.6, pulse 104, respiration 14, BP is 137/98, and patient 100% O2 sat on room air.  Patient weighs 210 pounds and BMI is 31.93. HEENT is unremarkable except for guarding at the right TMJ.  Patient has increased range of motion with opening the mouth from the last week's exam.       Assessment & Plan: Right jaw pain.   Patient consulted to ENT for definitive evaluation and treatment.

## 2021-06-14 NOTE — Addendum Note (Signed)
Addended by: Gardner Candle on: 06/14/2021 11:00 AM   Modules accepted: Orders

## 2021-06-17 NOTE — Progress Notes (Signed)
Cardiology Office Note:    Date:  06/18/2021   ID:  Henry Powell, DOB [birth year], MRN [id number]  PCP:  Patient, No Pcp Per (Inactive)  [facility] Cardiologist:  Dr. Cristine [facility] Electrophysiologist:  None   Referring MD: No ref. provider found   Chief Complaint: 4 week follow-up  History of Present Illness:    Henry Powell is a 41 y.o. male with a hx of with a hx of HTN, HLD, CVA 2020, and ETOH use who presents for follow-up for HTN.   Patinet has a h/o of HTN started on clonidine TID but diastolic pressures were still high. He had a stroke in 2020, CT head showed right PICA infarct. Echo 08/2019 showed normal systolic function. EF 50-60%, impaired relaxation.    seen 02/22/21 and BP was 124/100. He was started on irbesartan 150mg  daily with the plan to titrate off clonidine. Alcohol decrease encouraged.    Patient seen 04/14/21 and was Bps were much better. Irbesartan was increased to 300mg  daily.   Last seen 05/21/21 and was off clonidine. BP still elevated but patient wanted to pursue lifestyle changes before adding another medication.  Today, the patient reports he has been doing well. BP is better. He has made diet and activity changes. He has been walking 2 miles a day. Weight is up, but not likely accurate. He overall feels better. He is till drinking 2-3 alcohol drinks a day. Overall doing better.    Past Medical History:  Diagnosis Date   Anxiety state 06/11/2020   Hypertension    Stroke [facility])     Past Surgical History:  Procedure Laterality Date   NO PAST SURGERIES      Current Medications: No outpatient medications have been marked as taking for the 06/18/21 encounter (Appointment) with [facility], Johnda Billiot H, PA-C.     Allergies:   Patient has no known allergies.   Social History   Socioeconomic History   Marital status: Divorced    Spouse name: Not on file   Number of children: Not on file   Years of education: Not on file   Highest education  level: Not on file  Occupational History   Not on file  Tobacco Use   Smoking status: Never   Smokeless tobacco: Former    Types: Chew    Quit date: 08/18/2019  Substance and Sexual Activity   Alcohol use: Yes    Alcohol/week: 56.0 standard drinks    Types: 56 Cans of beer per week   Drug use: Never   Sexual activity: Not on file  Other Topics Concern   Not on file  Social History Narrative   Henry Powell works, please call after 4:00 pm.   Social Determinants of Health   Financial Resource Strain: Not on file  Food Insecurity: Not on file  Transportation Needs: Not on file  Physical Activity: Not on file  Stress: Not on file  Social Connections: Not on file     Family History: The patient's family history includes Diabetes in his mother; Hypertension in his father and mother; Stroke in his paternal uncle and paternal uncle.  ROS:   Please see the history of present illness.     All other systems reviewed and are negative.  EKGs/Labs/Other Studies Reviewed:    The following studies were reviewed today:    Echo 08/2019  1. Left ventricular ejection fraction, by visual estimation, is 55 to  60%. The left ventricle has normal function. There is no left ventricular  hypertrophy.   2. Left ventricular diastolic parameters are consistent with Grade I  diastolic dysfunction (impaired relaxation).   3. The left ventricle has no regional wall motion abnormalities.   4. Global right ventricle has normal systolic function.The right  ventricular size is normal.   5. Left atrial size was normal.   6. Right atrial size was normal.   7. The mitral valve is normal in structure. No evidence of mitral valve  regurgitation. No evidence of mitral stenosis.   8. The tricuspid valve is normal in structure. Tricuspid valve  regurgitation is trivial.   9. The aortic valve is normal in structure. Aortic valve regurgitation is  not visualized. No evidence of aortic valve sclerosis or  stenosis.  10. The pulmonic valve was normal in structure. Pulmonic valve  regurgitation is trivial.  11. The inferior vena cava is normal in size with greater than 50%  respiratory variability, suggesting right atrial pressure of 3 mmHg.  12. Normal LV systolic function; grade 1 diastolic dysfunction.     EKG:  EKG is not ordered today.   Recent Labs: 12/17/2020: ALT 54; Hemoglobin 14.0; Platelets 278; TSH 2.100 04/14/2021: BUN 17; Creatinine, Ser 1.11; Potassium 5.1; Sodium 137  Recent Lipid Panel    Component Value Date/Time   CHOL 165 12/17/2020 0830   TRIG 145 12/17/2020 0830   HDL 44 12/17/2020 0830   CHOLHDL 3.8 12/17/2020 0830   CHOLHDL 7.0 08/22/2019 0541   VLDL 31 08/22/2019 0541   LDLCALC 95 12/17/2020 0830     Physical Exam:    VS:  There were no vitals taken for this visit.    Wt Readings from Last 3 Encounters:  06/14/21 210 lb (95.3 kg)  06/03/21 215 lb (97.5 kg)  05/21/21 221 lb (100.2 kg)     GEN:  Well nourished, well developed in no acute distress HEENT: Normal NECK: No JVD; No carotid bruits LYMPHATICS: No lymphadenopathy CARDIAC: RRR, no murmurs, rubs, gallops RESPIRATORY:  Clear to auscultation without rales, wheezing or rhonchi  ABDOMEN: Soft, non-tender, non-distended MUSCULOSKELETAL:  No edema; No deformity  SKIN: Warm and dry NEUROLOGIC:  Alert and oriented x 3 PSYCHIATRIC:  Normal affect   ASSESSMENT:    1. Essential hypertension   2. Hyperlipidemia, mixed   3. Alcohol abuse   4. Cerebrovascular accident (CVA), unspecified mechanism (HCC)    PLAN:    In order of problems listed above:  HTN BP improved with diet and exercise. He plans to continue this. He was congratulated for his progress. Still wanting to cut down on alcohol use. Continue Irbesartan 300mg  daily. We will see him back in 6 months.    HLD LDL 95 in 12/2020 with goal less than 70. Continue statin and Lovaza.    Alcohol use He has decreased alcohol to 2 drinks a day  for which he was congratulated for. Encouraged to further decrease alcohol intake.    H/o CVA Continue Aspirin and statin. Recommend he establish with PCP.  Disposition: Follow up in 6 month(s) with MD/APP   Signed, Cordarius Benning 01/2021, PA-C  06/18/2021 9:01 AM    [facility]

## 2021-06-18 ENCOUNTER — Other Ambulatory Visit: Payer: Self-pay

## 2021-06-18 ENCOUNTER — Encounter: Payer: Self-pay | Admitting: Medical

## 2021-06-18 ENCOUNTER — Ambulatory Visit (INDEPENDENT_AMBULATORY_CARE_PROVIDER_SITE_OTHER): Payer: 59 | Admitting: Medical

## 2021-06-18 VITALS — BP 118/90 | HR 80 | Ht 68.0 in | Wt 223.0 lb

## 2021-06-18 DIAGNOSIS — F1010 Alcohol abuse, uncomplicated: Secondary | ICD-10-CM | POA: Diagnosis not present

## 2021-06-18 DIAGNOSIS — I10 Essential (primary) hypertension: Secondary | ICD-10-CM | POA: Diagnosis not present

## 2021-06-18 DIAGNOSIS — I639 Cerebral infarction, unspecified: Secondary | ICD-10-CM | POA: Diagnosis not present

## 2021-06-18 DIAGNOSIS — E782 Mixed hyperlipidemia: Secondary | ICD-10-CM

## 2021-06-18 DIAGNOSIS — R69 Illness, unspecified: Secondary | ICD-10-CM | POA: Diagnosis not present

## 2021-06-18 NOTE — Patient Instructions (Addendum)
Medication Instructions:  Your physician recommends that you continue on your current medications as directed. Please refer to the Current Medication list given to you today.  *If you need a refill on your cardiac medications before your next appointment, please call your pharmacy*   Lab Work: None ordered If you have labs (blood work) drawn today and your tests are completely normal, you will receive your results only by: MyChart Message (if you have MyChart) OR A paper copy in the mail If you have any lab test that is abnormal or we need to change your treatment, we will call you to review the results.   Testing/Procedures: None ordered   Follow-Up: At [facility], you and your health needs are our priority.  As part of our continuing mission to provide you with exceptional heart care, we have created designated Provider Care Teams.  These Care Teams include your primary Cardiologist (physician) and Advanced Practice Providers (APPs -  Physician Assistants and Nurse Practitioners) who all work together to provide you with the care you need, when you need it.  We recommend signing up for the patient portal called "MyChart".  Sign up information is provided on this After Visit Summary.  MyChart is used to connect with patients for Virtual Visits (Telemedicine).  Patients are able to view lab/test results, encounter notes, upcoming appointments, etc.  Non-urgent messages can be sent to your provider as well.   To learn more about what you can do with MyChart, go to [email or web address].    Your next appointment:   6 month(s)  The format for your next appointment:   In Person  Provider:   You may see Brian Agbor-Etang, MD or one of the following Advanced Practice Providers on your designated Care Team:   Christopher Berge, NP Ryan Dunn, PA-C Jacquelyn Visser, PA-C Cadence Furth, PA-C   Other Instructions   

## 2021-08-03 ENCOUNTER — Other Ambulatory Visit: Payer: Self-pay

## 2021-08-03 DIAGNOSIS — F411 Generalized anxiety disorder: Secondary | ICD-10-CM

## 2021-08-03 MED ORDER — SERTRALINE HCL 50 MG PO TABS: 50.0000 mg | ORAL_TABLET | Freq: Every day | ORAL | 3 refills | Status: DC

## 2021-08-24 ENCOUNTER — Other Ambulatory Visit: Payer: Self-pay

## 2021-08-24 DIAGNOSIS — Z1152 Encounter for screening for COVID-19: Secondary | ICD-10-CM

## 2021-08-24 LAB — POCT INFLUENZA A/B
Influenza A, POC: NEGATIVE
Influenza B, POC: NEGATIVE

## 2021-08-24 LAB — POC COVID19 BINAXNOW: SARS Coronavirus 2 Ag: NEGATIVE

## 2021-08-24 NOTE — Progress Notes (Signed)
Pt presents outside for rapid COVID,FLU and PCR COVID./CL,RMA

## 2021-08-25 LAB — NOVEL CORONAVIRUS, NAA: SARS-CoV-2, NAA: NOT DETECTED

## 2021-08-30 ENCOUNTER — Other Ambulatory Visit: Payer: Self-pay

## 2021-08-30 DIAGNOSIS — F411 Generalized anxiety disorder: Secondary | ICD-10-CM

## 2021-08-30 MED ORDER — SERTRALINE HCL 50 MG PO TABS: 50.0000 mg | ORAL_TABLET | Freq: Every day | ORAL | 3 refills | Status: DC

## 2021-10-01 ENCOUNTER — Other Ambulatory Visit: Payer: Self-pay

## 2021-10-01 DIAGNOSIS — Z0283 Encounter for blood-alcohol and blood-drug test: Secondary | ICD-10-CM

## 2021-10-01 NOTE — Progress Notes (Signed)
Presents to [place] for Random DOT Drug Screen.  [facility] Acct #:  [id number] Random Specimen #:  [id number]  AMD

## 2021-10-18 ENCOUNTER — Other Ambulatory Visit: Payer: Self-pay

## 2021-10-18 DIAGNOSIS — E7801 Familial hypercholesterolemia: Secondary | ICD-10-CM

## 2021-10-18 MED ORDER — ROSUVASTATIN CALCIUM 40 MG PO TABS: 40.0000 mg | ORAL_TABLET | Freq: Every day | ORAL | 3 refills | Status: DC

## 2021-11-07 IMAGING — CT CT HEAD W/O CM
4 series · 17 of 47 positions shown, 19 images · non-contrast
Comparison: Yesterday

CLINICAL DATA: Stroke follow-up

EXAM:
CT HEAD WITHOUT CONTRAST
TECHNIQUE: Contiguous axial images were obtained from the base of the skull
through the vertex without intravenous contrast.

[Series 3: head wo · axial · 0.45mm/px · z∈[-128,-2]mm · 7 of 35 slices shown, 9 images]
[im 5/35  brain]
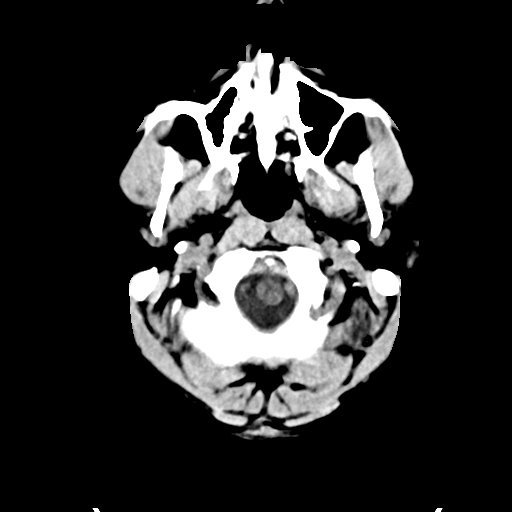
[im 5/35  bone]
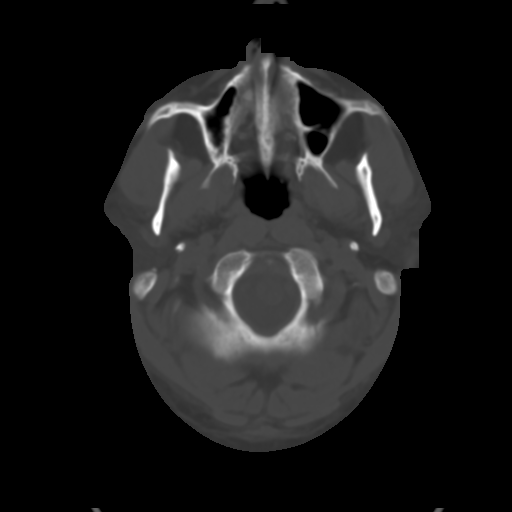
[im 9/35  brain]
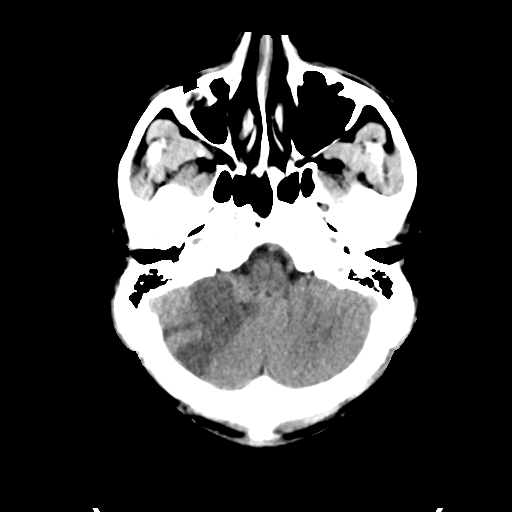
[im 13/35  brain]
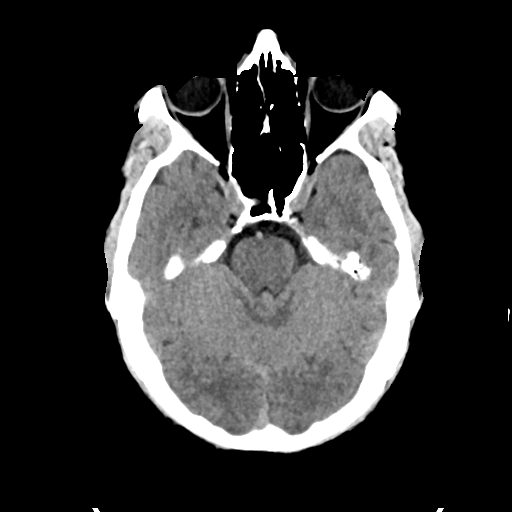
[im 18/35  brain]
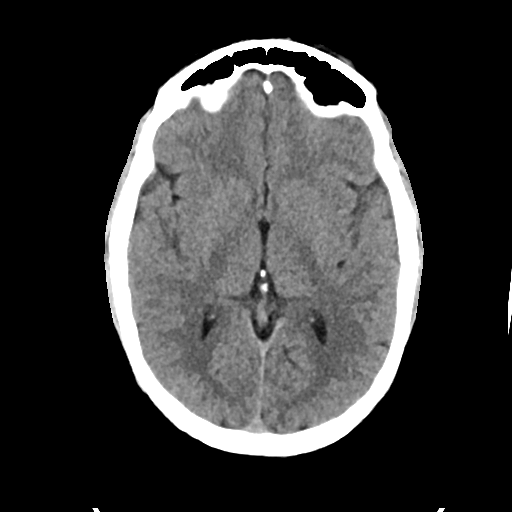
[im 22/35  brain]
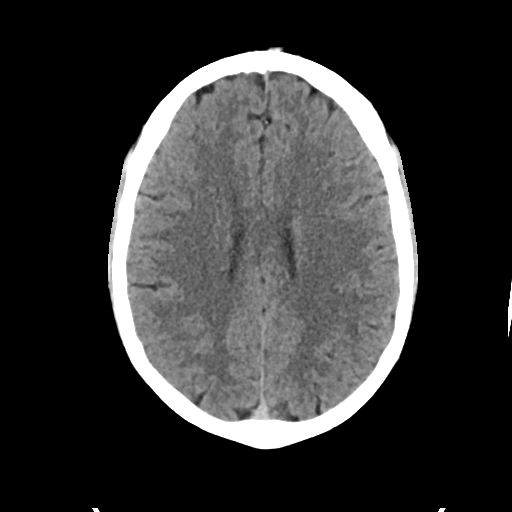
[im 22/35  bone]
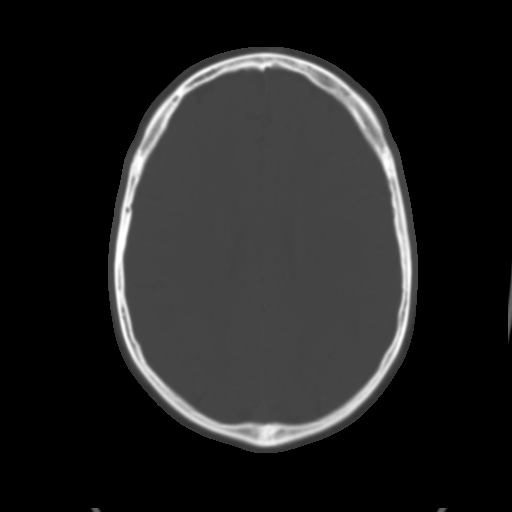
[im 26/35  brain]
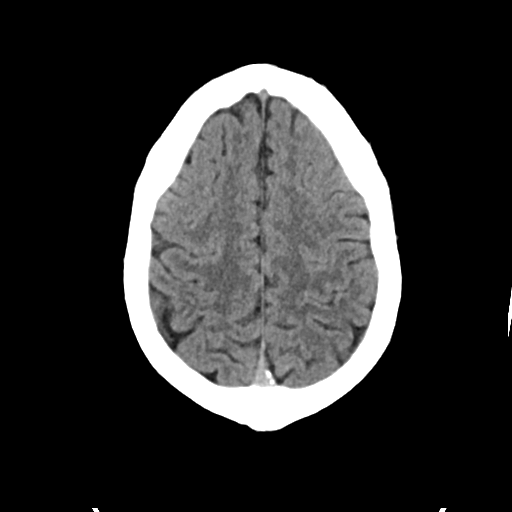
[im 30/35  brain]
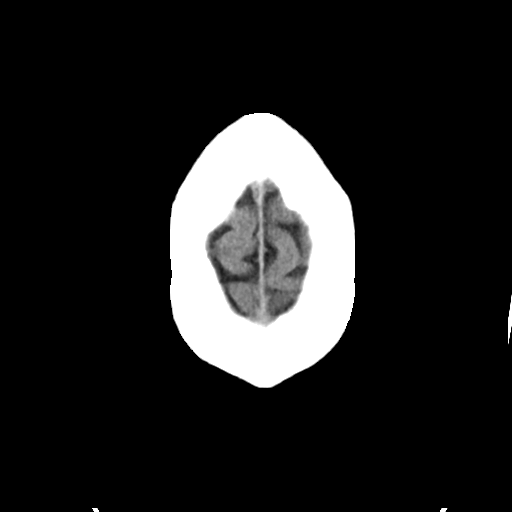

[Series 4: head bone · axial · 0.45mm/px · z∈[-132,-70]mm · 4 of 88 slices shown]
[im 9/88  bone]
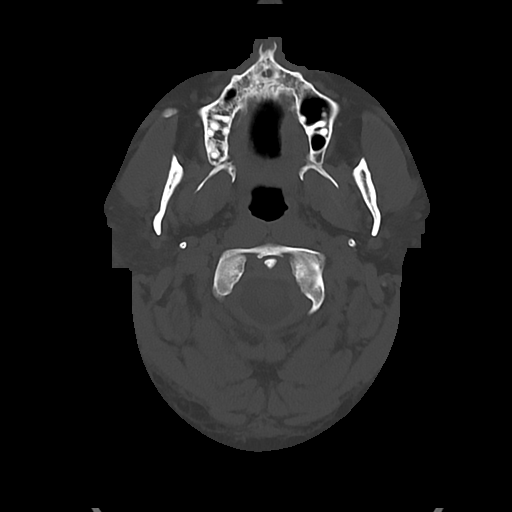
[im 18/88  bone]
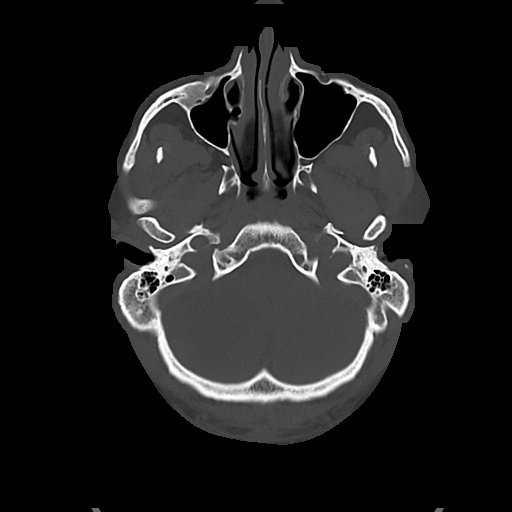
[im 27/88  bone]
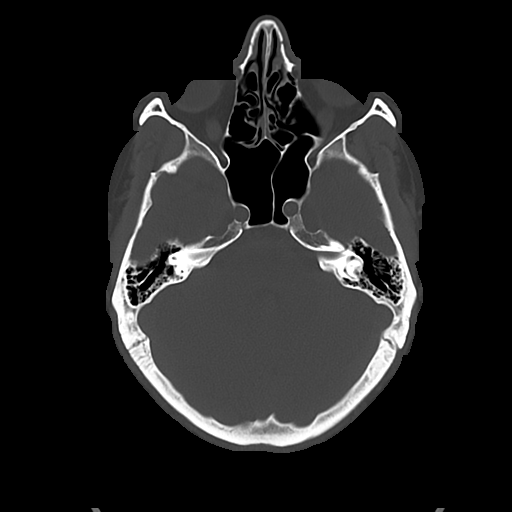
[im 40/88  bone]
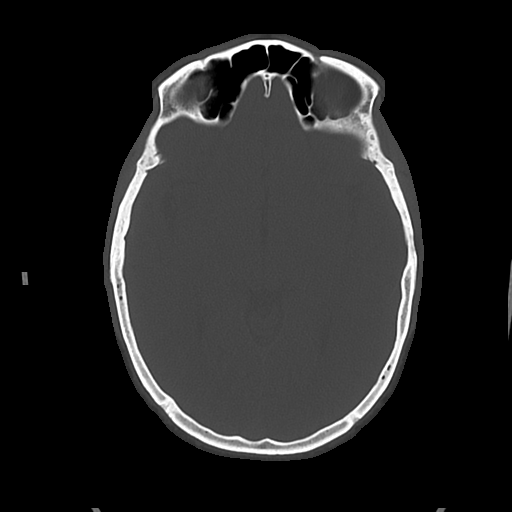

[Series 5: cor soft · coronal · 0.34mm/px · 3 of 71 slices shown]
[im 24/71  brain]
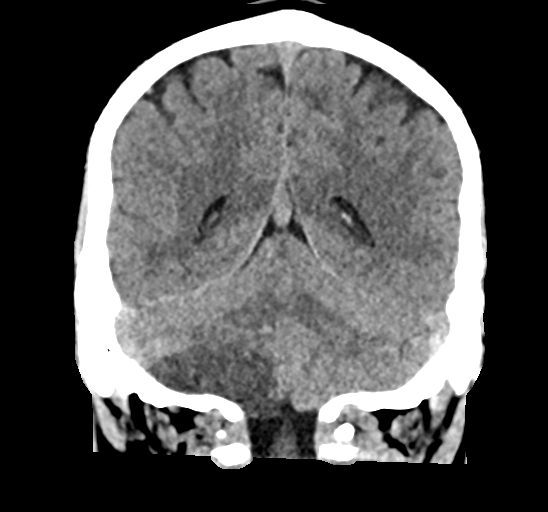
[im 32/71  brain]
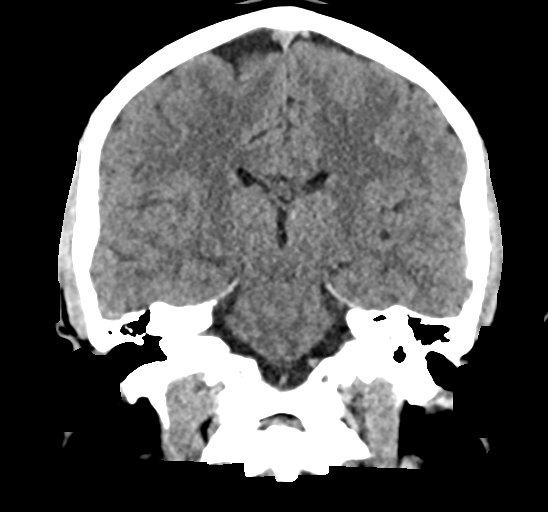
[im 39/71  brain]
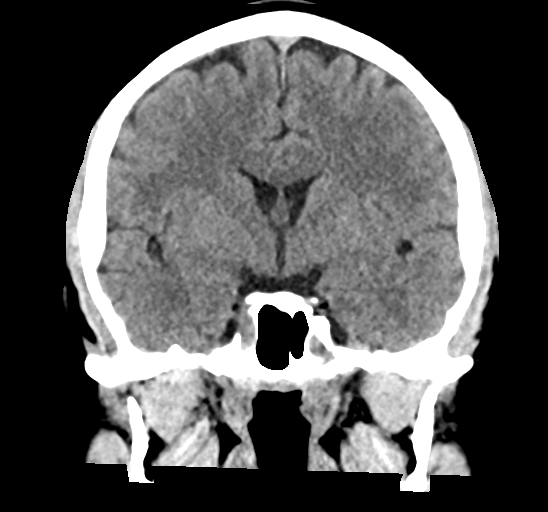

[Series 6: sag soft · sagittal · 0.36mm/px · 3 of 62 slices shown]
[im 21/62  brain]
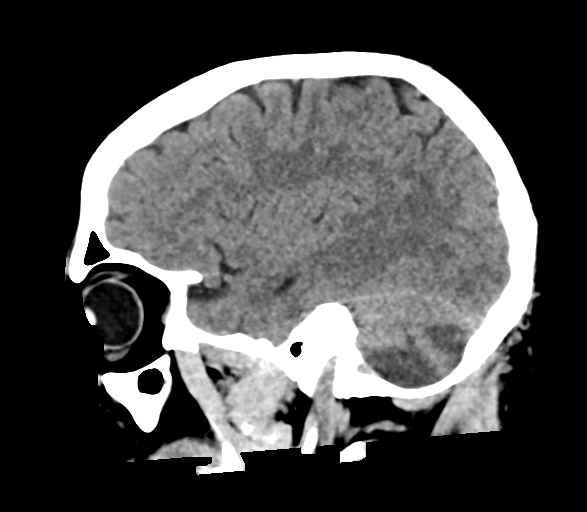
[im 31/62  brain]
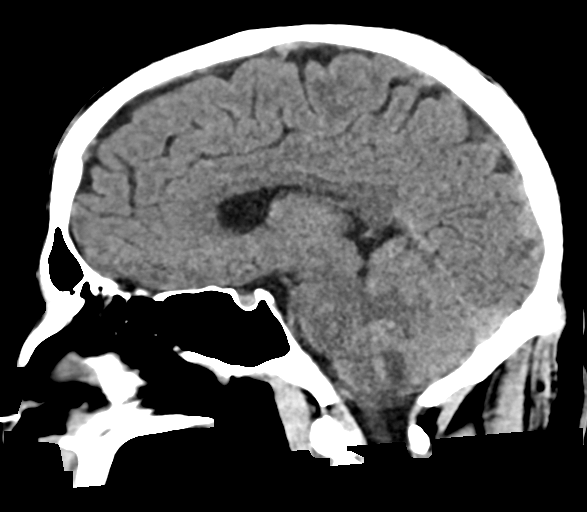
[im 41/62  brain]
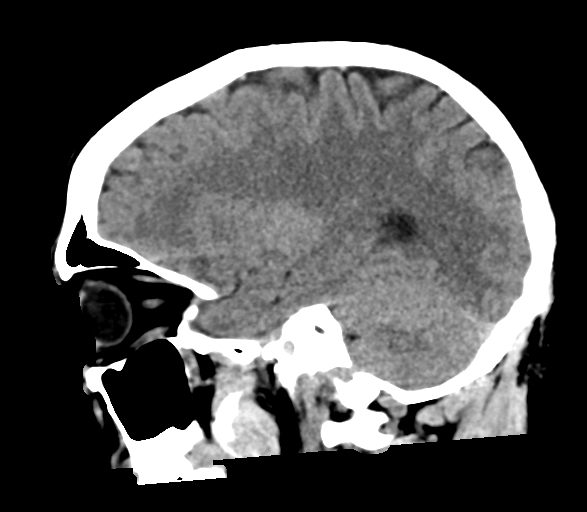

[17 of 47 positions shown; findings below may reference images not displayed]

FINDINGS: Brain: Recent right PICA territory infarct with unchanged extent and
degree of swelling. The fourth ventricle remains patent and there is
no hydrocephalus. No hemorrhage, shift, or extra-axial collection

Vascular: CTA from yesterday.

Skull: Normal

Sinuses/Orbits: Normal
IMPRESSION: Stable extent of right PICA territory infarction. Fourth ventricle
remains patent and there is no hydrocephalus.

## 2021-12-07 ENCOUNTER — Ambulatory Visit: Payer: 59 | Admitting: Medical

## 2021-12-13 ENCOUNTER — Encounter: Payer: 59 | Admitting: Physician Assistant

## 2021-12-15 NOTE — Progress Notes (Signed)
?Cardiology Office Note:   ? ?Date:  12/16/2021  ? ?ID:  Henry Powell, DOB 1981/02/13, MRN [id number] ? ?PCP:  Patient, No Pcp Per (Inactive)  ?[facility] Cardiologist:  Debbe Odea, MD  ?[facility] Electrophysiologist:  None  ? ?Referring MD: No ref. provider found  ? ?Chief Complaint: 6 month follow-up ? ?History of Present Illness:   ? ?Henry Powell is a 41 y.o. male with a hx of with a hx of with a hx of HTN, HLD, CVA 2020, and ETOH use who presents for follow-up for HTN. ?  ?Patinet has a h/o of HTN started on clonidine TID but diastolic pressures were still high. He had a stroke in 2020, CT head showed right PICA infarct. Echo 08/2019 showed normal systolic function. EF 50-60%, impaired relaxation.  ?  ?seen 02/22/21 and BP was 124/100. He was started on irbesartan 150mg  daily with the plan to titrate off clonidine. Alcohol decrease encouraged.  ?  ?Patient seen 04/14/21 and was Bps were much better. Irbesartan was increased to 300mg  daily.  ? ?Last seen 06/18/21 and was doing well, BP was better.  ? ?Today, the patient reports he has been doing well. The patient feels good. BP is good. He has been much more active with his kids. He has cut back on the drinking, now 3 beers a day. No smoking. Diet has been getting better, cooking more at home and eating more greens. He will get blood work with his PCP in May.  ? ?Past Medical History:  ?Diagnosis Date  ? Anxiety state 06/11/2020  ? Hypertension   ? Stroke [facility])   ? ? ?Past Surgical History:  ?Procedure Laterality Date  ? NO PAST SURGERIES    ? ? ?Current Medications: ?Current Meds  ?Medication Sig  ? aspirin 81 MG chewable tablet Chew by mouth daily.  ? cyanocobalamin 1000 MCG tablet Take 1 tablet (1,000 mcg total) by mouth daily.  ? irbesartan (AVAPRO) 150 MG tablet Take 2 tablets (300 mg total) by mouth daily.  ? omega-3 acid ethyl esters (LOVAZA) 1 g capsule Take 1 g by mouth daily.  ? rosuvastatin (CRESTOR) 40 MG tablet Take 1 tablet (40 mg total)  by mouth daily.  ? sertraline (ZOLOFT) 50 MG tablet Take 1 tablet (50 mg total) by mouth daily.  ?  ? ?Allergies:   Patient has no known allergies.  ? ?Social History  ? ?Socioeconomic History  ? Marital status: Divorced  ?  Spouse name: Not on file  ? Number of children: Not on file  ? Years of education: Not on file  ? Highest education level: Not on file  ?Occupational History  ? Not on file  ?Tobacco Use  ? Smoking status: Never  ? Smokeless tobacco: Former  ?  Types: Chew  ?  Quit date: 08/18/2019  ?Substance and Sexual Activity  ? Alcohol use: Yes  ?  Alcohol/week: 56.0 standard drinks  ?  Types: 56 Cans of beer per week  ? Drug use: Never  ? Sexual activity: Not on file  ?Other Topics Concern  ? Not on file  ?Social History Narrative  ? Mr. Villella works, please call after 4:00 pm.  ? ?Social Determinants of Health  ? ?Financial Resource Strain: Not on file  ?Food Insecurity: Not on file  ?Transportation Needs: Not on file  ?Physical Activity: Not on file  ?Stress: Not on file  ?Social Connections: Not on file  ?  ? ?Family History: ?The patient's family history  includes Diabetes in his mother; Hypertension in his father and mother; Stroke in his paternal uncle and paternal uncle. ? ?ROS:   ?Please see the history of present illness.    ? All other systems reviewed and are negative. ? ?EKGs/Labs/Other Studies Reviewed:   ? ?The following studies were reviewed today: ? ?Echo 08/2019 ? 1. Left ventricular ejection fraction, by visual estimation, is 55 to  ?60%. The left ventricle has normal function. There is no left ventricular  ?hypertrophy.  ? 2. Left ventricular diastolic parameters are consistent with Grade I  ?diastolic dysfunction (impaired relaxation).  ? 3. The left ventricle has no regional wall motion abnormalities.  ? 4. Global right ventricle has normal systolic function.The right  ?ventricular size is normal.  ? 5. Left atrial size was normal.  ? 6. Right atrial size was normal.  ? 7. The mitral  valve is normal in structure. No evidence of mitral valve  ?regurgitation. No evidence of mitral stenosis.  ? 8. The tricuspid valve is normal in structure. Tricuspid valve  ?regurgitation is trivial.  ? 9. The aortic valve is normal in structure. Aortic valve regurgitation is  ?not visualized. No evidence of aortic valve sclerosis or stenosis.  ?10. The pulmonic valve was normal in structure. Pulmonic valve  ?regurgitation is trivial.  ?11. The inferior vena cava is normal in size with greater than 50%  ?respiratory variability, suggesting right atrial pressure of 3 mmHg.  ?12. Normal LV systolic function; grade 1 diastolic dysfunction.  ?  ? ?EKG:  EKG is  ordered today.  The ekg ordered today demonstrates NSR, 94bpm, nonspecific ST wave changes.  ? ?Recent Labs: ?12/17/2020: ALT 54; Hemoglobin 14.0; Platelets 278; TSH 2.100 ?04/14/2021: BUN 17; Creatinine, Ser 1.11; Potassium 5.1; Sodium 137  ?Recent Lipid Panel ?   ?Component Value Date/Time  ? CHOL 165 12/17/2020 0830  ? TRIG 145 12/17/2020 0830  ? HDL 44 12/17/2020 0830  ? CHOLHDL 3.8 12/17/2020 0830  ? CHOLHDL 7.0 08/22/2019 0541  ? VLDL 31 08/22/2019 0541  ? LDLCALC 95 12/17/2020 0830  ? ? ?Physical Exam:   ? ?VS:  BP 132/90 (BP Location: Left Arm, Patient Position: Sitting, Cuff Size: Normal)   Pulse 94   Ht 5\' 8"  (1.727 m)   Wt 222 lb (100.7 kg)   SpO2 98%   BMI 33.75 kg/m?    ? ?Wt Readings from Last 3 Encounters:  ?12/16/21 222 lb (100.7 kg)  ?06/18/21 223 lb (101.2 kg)  ?06/14/21 210 lb (95.3 kg)  ?  ? ?GEN:  Well nourished, well developed in no acute distress ?HEENT: Normal ?NECK: No JVD; No carotid bruits ?LYMPHATICS: No lymphadenopathy ?CARDIAC: RRR, no murmurs, rubs, gallops ?RESPIRATORY:  Clear to auscultation without rales, wheezing or rhonchi  ?ABDOMEN: Soft, non-tender, non-distended ?MUSCULOSKELETAL:  No edema; No deformity  ?SKIN: Warm and dry ?NEUROLOGIC:  Alert and oriented x 3 ?PSYCHIATRIC:  Normal affect  ? ?ASSESSMENT:   ? ?1. Essential  hypertension   ?2. Hyperlipidemia, mixed   ?3. Alcohol abuse   ?4. Cerebrovascular accident (CVA), unspecified mechanism (HCC)   ? ?PLAN:   ? ?In order of problems listed above: ? ?HTN ?BP good today. He was congratulated for lifestyle changes. Continue Irbesartan.  ? ?HLD ?LDL 95 in 12/2020 with goal<70. He is on statin and Lovaza. He has made good lifestyle changes. PCP will check lipid panel in May.  ? ?Alcohol use ?He is down to drinking 3 beers a day, complete cessation was  encouraged.  ? ?H/o CVA ?Continue Aspirin, statin and Lovaza.  ? ?Disposition: Follow up in 1 year(s) with MD/APP  ? ? ?Signed, ?Zoeya Gramajo David Stall, PA-C  ?12/16/2021 8:24 AM    ? [facility]  ?

## 2021-12-16 ENCOUNTER — Ambulatory Visit (INDEPENDENT_AMBULATORY_CARE_PROVIDER_SITE_OTHER): Payer: 59 | Admitting: Medical

## 2021-12-16 ENCOUNTER — Encounter: Payer: Self-pay | Admitting: Medical

## 2021-12-16 VITALS — BP 132/90 | HR 94 | Ht 68.0 in | Wt 222.0 lb

## 2021-12-16 DIAGNOSIS — F1010 Alcohol abuse, uncomplicated: Secondary | ICD-10-CM | POA: Diagnosis not present

## 2021-12-16 DIAGNOSIS — E782 Mixed hyperlipidemia: Secondary | ICD-10-CM

## 2021-12-16 DIAGNOSIS — I639 Cerebral infarction, unspecified: Secondary | ICD-10-CM

## 2021-12-16 DIAGNOSIS — R69 Illness, unspecified: Secondary | ICD-10-CM | POA: Diagnosis not present

## 2021-12-16 DIAGNOSIS — I10 Essential (primary) hypertension: Secondary | ICD-10-CM

## 2021-12-16 NOTE — Patient Instructions (Signed)
Medication Instructions:  ?Your physician recommends that you continue on your current medications as directed. Please refer to the Current Medication list given to you today. ? ?*If you need a refill on your cardiac medications before your next appointment, please call your pharmacy* ? ? ?Lab Work: ?None ordered ?If you have labs (blood work) drawn today and your tests are completely normal, you will receive your results only by: ?MyChart Message (if you have MyChart) OR ?A paper copy in the mail ?If you have any lab test that is abnormal or we need to change your treatment, we will call you to review the results. ? ? ?Testing/Procedures: ?None ordered ? ? ?Follow-Up: ?At [facility], you and your health needs are our priority.  As part of our continuing mission to provide you with exceptional heart care, we have created designated Provider Care Teams.  These Care Teams include your primary Cardiologist (physician) and Advanced Practice Providers (APPs -  Physician Assistants and Nurse Practitioners) who all work together to provide you with the care you need, when you need it. ? ?We recommend signing up for the patient portal called "MyChart".  Sign up information is provided on this After Visit Summary.  MyChart is used to connect with patients for Virtual Visits (Telemedicine).  Patients are able to view lab/test results, encounter notes, upcoming appointments, etc.  Non-urgent messages can be sent to your provider as well.   ?To learn more about what you can do with MyChart, go to [email or web address].   ? ?Your next appointment:   ?Your physician wants you to follow-up in: 1 year You will receive a reminder letter in the mail two months in advance. If you don't receive a letter, please call our office to schedule the follow-up appointment. ? ? ?The format for your next appointment:   ?In Person ? ?Provider:   ?You may see Brian Agbor-Etang, MD or one of the following Advanced Practice Providers on your  designated Care Team:   ?Christopher Berge, NP ?Ryan Dunn, PA-C ?Cadence Furth, PA-C{ ? ? ? ?Other Instructions ?N/A ? ?

## 2021-12-23 ENCOUNTER — Ambulatory Visit: Payer: Self-pay | Admitting: Physician Assistant

## 2021-12-23 ENCOUNTER — Ambulatory Visit
Admission: RE | Admit: 2021-12-23 | Discharge: 2021-12-23 | Disposition: A | Discharge status: Home / Self Care | Payer: 59 | Source: Ambulatory Visit | Attending: Physician Assistant | Admitting: Physician Assistant

## 2021-12-23 ENCOUNTER — Ambulatory Visit
Admission: RE | Admit: 2021-12-23 | Discharge: 2021-12-23 | Disposition: A | Discharge status: Home / Self Care | Payer: 59 | Attending: Physician Assistant | Admitting: Physician Assistant

## 2021-12-23 ENCOUNTER — Encounter: Payer: Self-pay | Admitting: Physician Assistant

## 2021-12-23 VITALS — BP 152/108 | HR 78 | Temp 97.8°F | Resp 18 | Wt 228.0 lb

## 2021-12-23 DIAGNOSIS — R0789 Other chest pain: Secondary | ICD-10-CM

## 2021-12-23 MED ORDER — KETOROLAC TROMETHAMINE 30 MG/ML IJ SOLN
30.0000 mg | Freq: Once | INTRAMUSCULAR | Status: AC
  Administered 2021-12-23: 30 mg via INTRAMUSCULAR

## 2021-12-23 MED ORDER — KETOROLAC TROMETHAMINE 10 MG PO TABS
10.0000 mg | ORAL_TABLET | Freq: Four times a day (QID) | ORAL | 0 refills | Status: DC | PRN
Start: 2021-12-23 — End: 2022-05-30

## 2021-12-23 MED ORDER — ORPHENADRINE CITRATE ER 100 MG PO TB12: 100.0000 mg | ORAL_TABLET | Freq: Two times a day (BID) | ORAL | 0 refills | Status: DC

## 2021-12-23 NOTE — Progress Notes (Signed)
Work Incident 12/23/2021 at 900 am: ? ?Right side pain ?Sweeping rock in a hole & pushed too much rock & heard a pop in the right rib area. ? ?Rates pain a 7 on a scale of 0-10 ?Intermittent sharp pain ? ?No OTC meds  ? ?AMD ? ?

## 2021-12-23 NOTE — Progress Notes (Signed)
? ?  Subjective: Right rib pain  ? ? Patient ID: Henry Powell, male    DOB: 1981-08-30, 41 y.o.   MRN: [id number] ? ?HPI ?Patient presents with acute right rib pain secondary to a pushing incident at work today.  Patient state while pushing loads of rocks he felt a popping sensation followed by acute pain.  Patient states pain increased with deep inspiration.  Rates the pain as a 7/10.  Describes the pain as "aching".  No palliative measures prior to arrival. ? ? ? ? ?   ?Objective:  ? Physical Exam ?Temperature is 97.8, respiration 18, pulse 78, BP is 152/108, and patient 97% O2 sat on room air.  Patient appears to be in moderate distress. ?No obvious chest wall deformity.  No abrasion or ecchymosis.  Patient has moderate guarding palpation right lateral rib area. ? ? ? ?   ?Assessment & Plan: Left rib pain.  ?Patient given Toradol 60 mg IM and a Lidoderm patch was applied to affected area.  Patient sent for imaging.  Patient given prescription for Toradol and Norflex.  Patient will follow-up status post imaging report. ? ?

## 2021-12-27 ENCOUNTER — Ambulatory Visit: Payer: Self-pay | Admitting: Physician Assistant

## 2021-12-27 ENCOUNTER — Encounter: Payer: Self-pay | Admitting: Physician Assistant

## 2021-12-27 DIAGNOSIS — S20211S Contusion of right front wall of thorax, sequela: Secondary | ICD-10-CM

## 2021-12-27 NOTE — Progress Notes (Signed)
? ?  Subjective: Right rib pain  ? ? Patient ID: Henry Powell, male    DOB: Jan 27, 1981, 41 y.o.   MRN: [id number] ? ?HPI ?Patient is following up telephonically for results of x-ray of the right rib and chest wall secondary to contusion which occurred on 12/23/2021. ? ? ?Review of Systems ?Negative except for complaint ?   ?Objective:  ? Physical Exam ? ?This is a telephonic visit. ?Discussed no acute findings on x-ray of the left lateral rib and chest wall. ? ?   ?Assessment & Plan: Chest wall contusion  ?Patient advised continue conservative teeth care return back to full duty on 12/28/2021.  Return back to clinic if condition worsens. ? ?

## 2022-01-06 ENCOUNTER — Encounter: Payer: 59 | Admitting: Physician Assistant

## 2022-01-11 ENCOUNTER — Other Ambulatory Visit: Payer: Self-pay

## 2022-01-11 DIAGNOSIS — E7801 Familial hypercholesterolemia: Secondary | ICD-10-CM

## 2022-01-11 MED ORDER — ROSUVASTATIN CALCIUM 40 MG PO TABS: 40.0000 mg | ORAL_TABLET | Freq: Every day | ORAL | 3 refills | Status: DC

## 2022-01-19 ENCOUNTER — Encounter: Payer: 59 | Admitting: Physician Assistant

## 2022-01-28 ENCOUNTER — Telehealth: Payer: Self-pay | Admitting: Cardiology

## 2022-01-28 MED ORDER — IRBESARTAN 150 MG PO TABS: 300.0000 mg | ORAL_TABLET | Freq: Every day | ORAL | 2 refills | Status: DC

## 2022-01-28 NOTE — Telephone Encounter (Signed)
*  STAT* If patient is at the pharmacy, call can be transferred to refill team.   1. Which medications need to be refilled? (please list name of each medication and dose if known)  irbesartan (AVAPRO) 150 MG tablet  2. Which pharmacy/location (including street and city if local pharmacy) is medication to be sent to? [id number] - [place], [place] - [address]    3. Do they need a 30 day or 90 day supply?  90 day supply 

## 2022-01-28 NOTE — Telephone Encounter (Signed)
Requested Prescriptions   Signed Prescriptions Disp Refills   irbesartan (AVAPRO) 150 MG tablet 180 tablet 2    Sig: Take 2 tablets (300 mg total) by mouth daily.    Authorizing Provider: Marianne Sofia    Ordering User: Thayer Headings, [place]oa Hattabaugh L

## 2022-01-31 ENCOUNTER — Ambulatory Visit: Payer: Self-pay

## 2022-01-31 DIAGNOSIS — Z0000 Encounter for general adult medical examination without abnormal findings: Secondary | ICD-10-CM

## 2022-01-31 LAB — POCT URINALYSIS DIPSTICK
Bilirubin, UA: NEGATIVE
Blood, UA: NEGATIVE
Glucose, UA: NEGATIVE
Ketones, UA: NEGATIVE
Leukocytes, UA: NEGATIVE
Nitrite, UA: NEGATIVE
Protein, UA: NEGATIVE
Spec Grav, UA: 1.02 (ref 1.010–1.025)
Urobilinogen, UA: 0.2 E.U./dL
pH, UA: 5.5 (ref 5.0–8.0)

## 2022-01-31 NOTE — Progress Notes (Signed)
Pt presents today for physical labs, will return to clinic for scheduled physical.  

## 2022-02-01 LAB — CMP12+LP+TP+TSH+6AC+CBC/D/PLT
ALT: 52 IU/L — ABNORMAL HIGH (ref 0–44)
AST: 25 IU/L (ref 0–40)
Albumin/Globulin Ratio: 1.9 (ref 1.2–2.2)
Albumin: 4.8 g/dL (ref 4.0–5.0)
Alkaline Phosphatase: 89 IU/L (ref 44–121)
BUN/Creatinine Ratio: 12 (ref 9–20)
BUN: 14 mg/dL (ref 6–20)
Basophils Absolute: 0.1 10*3/uL (ref 0.0–0.2)
Basos: 1 %
Bilirubin Total: 0.3 mg/dL (ref 0.0–1.2)
Calcium: 9.9 mg/dL (ref 8.7–10.2)
Chloride: 100 mmol/L (ref 96–106)
Chol/HDL Ratio: 4.8 ratio (ref 0.0–5.0)
Cholesterol, Total: 217 mg/dL — ABNORMAL HIGH (ref 100–199)
Creatinine, Ser: 1.21 mg/dL (ref 0.76–1.27)
EOS (ABSOLUTE): 0.2 10*3/uL (ref 0.0–0.4)
Eos: 3 %
Estimated CHD Risk: 1 times avg. (ref 0.0–1.0)
Free Thyroxine Index: 1.2 (ref 1.2–4.9)
GGT: 51 IU/L (ref 0–65)
Globulin, Total: 2.5 g/dL (ref 1.5–4.5)
Glucose: 102 mg/dL — ABNORMAL HIGH (ref 70–99)
HDL: 45 mg/dL (ref >39)
Hematocrit: 40.9 % (ref 37.5–51.0)
Hemoglobin: 14.2 g/dL (ref 13.0–17.7)
Immature Grans (Abs): 0 10*3/uL (ref 0.0–0.1)
Immature Granulocytes: 0 %
Iron: 85 ug/dL (ref 38–169)
LDH: 175 IU/L (ref 121–224)
LDL Chol Calc (NIH): 142 mg/dL — ABNORMAL HIGH (ref 0–99)
Lymphocytes Absolute: 2.6 10*3/uL (ref 0.7–3.1)
Lymphs: 35 %
MCH: 29.9 pg (ref 26.6–33.0)
MCHC: 34.7 g/dL (ref 31.5–35.7)
MCV: 86 fL (ref 79–97)
Monocytes Absolute: 0.6 10*3/uL (ref 0.1–0.9)
Monocytes: 8 %
Neutrophils Absolute: 4 10*3/uL (ref 1.4–7.0)
Neutrophils: 53 %
Phosphorus: 3.6 mg/dL (ref 2.8–4.1)
Platelets: 331 10*3/uL (ref 150–450)
Potassium: 4.9 mmol/L (ref 3.5–5.2)
RBC: 4.75 x10E6/uL (ref 4.14–5.80)
RDW: 13.2 % (ref 11.6–15.4)
Sodium: 137 mmol/L (ref 134–144)
T3 Uptake Ratio: 22 % — ABNORMAL LOW (ref 24–39)
T4, Total: 5.3 ug/dL (ref 4.5–12.0)
TSH: 1.56 u[IU]/mL (ref 0.450–4.500)
Total Protein: 7.3 g/dL (ref 6.0–8.5)
Triglycerides: 165 mg/dL — ABNORMAL HIGH (ref 0–149)
Uric Acid: 6.4 mg/dL (ref 3.8–8.4)
VLDL Cholesterol Cal: 30 mg/dL (ref 5–40)
WBC: 7.5 10*3/uL (ref 3.4–10.8)
eGFR: 78 mL/min/{1.73_m2} (ref >59)

## 2022-02-08 ENCOUNTER — Ambulatory Visit: Payer: Self-pay | Admitting: Physician Assistant

## 2022-02-08 ENCOUNTER — Encounter: Payer: Self-pay | Admitting: Physician Assistant

## 2022-02-08 VITALS — BP 128/103 | HR 77 | Temp 97.6°F | Resp 14 | Ht 68.0 in | Wt 218.0 lb

## 2022-02-08 DIAGNOSIS — Z0000 Encounter for general adult medical examination without abnormal findings: Secondary | ICD-10-CM

## 2022-02-08 DIAGNOSIS — E782 Mixed hyperlipidemia: Secondary | ICD-10-CM

## 2022-02-08 DIAGNOSIS — I10 Essential (primary) hypertension: Secondary | ICD-10-CM

## 2022-02-08 MED ORDER — SERTRALINE HCL 100 MG PO TABS: 100.0000 mg | ORAL_TABLET | Freq: Every day | ORAL | 3 refills | Status: DC

## 2022-02-08 NOTE — Progress Notes (Signed)
[facility]  ____________________________________________   None    (approximate)  I have reviewed the triage vital signs and the nursing notes.   HISTORY  Chief Complaint Annual Exam  HPI Henry Powell is a 41 y.o. male patient presents for annual physical exam.  Patient admits to noncompliance of statin for lipid control.         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 Authorizing Provider  aspirin 81 MG chewable tablet Chew by mouth daily.   Yes [provider]  cyanocobalamin 1000 MCG tablet Take 1 tablet (1,000 mcg total) by mouth daily. 11/14/19  Yes McCue, Janett Billow, NP  irbesartan (AVAPRO) 150 MG tablet Take 2 tablets (300 mg total) by mouth daily. 01/28/22  Yes Furth, Cadence H, PA-C  ketorolac (TORADOL) 10 MG tablet Take 1 tablet (10 mg total) by mouth every 6 (six) hours as needed. 12/23/21  Yes Sable Feil, PA-C  omega-3 acid ethyl esters (LOVAZA) 1 g capsule Take 1 g by mouth daily.   Yes [provider]  orphenadrine (NORFLEX) 100 MG tablet Take 1 tablet (100 mg total) by mouth 2 (two) times daily. 12/23/21  Yes Sable Feil, PA-C  rosuvastatin (CRESTOR) 40 MG tablet Take 1 tablet (40 mg total) by mouth daily. 01/11/22  Yes Letitia Neri L, PA-C  sertraline (ZOLOFT) 50 MG tablet Take 1 tablet  (50 mg total) by mouth daily. 08/30/21  Yes Sable Feil, PA-C    Allergies Patient has no known allergies.  Family History  Problem Relation Age of Onset   Hypertension Mother    Diabetes Mother    Hypertension Father    Stroke Paternal Uncle    Stroke Paternal Uncle     Social History Social History   Tobacco Use   Smoking status: Never   Smokeless tobacco: Former    Types: Chew    Quit date: 08/18/2019  Substance Use Topics   Alcohol use: Yes    Alcohol/week: 56.0 standard drinks    Types: 56 Cans of beer per week   Drug use: Never    Review of Systems Constitutional: No fever/chills Eyes: No visual changes. ENT: No sore throat. Cardiovascular: Denies chest pain. Respiratory: Denies shortness of breath. Gastrointestinal: No abdominal pain.  No nausea, no vomiting.  No diarrhea.  No constipation. Genitourinary: Negative for dysuria. Musculoskeletal: Negative for back pain. Skin: Negative for rash. Neurological: Negative for headaches, focal weakness or numbness. Psychiatric: Anxiety Endocrine: Hyperlipidemia hypertension   ____________________________________________   PHYSICAL EXAM:  VITAL SIGNS: See nurses note for vital signs Constitutional: Alert and oriented. Well appearing and in no acute distress. Eyes: Conjunctivae are normal. PERRL. EOMI. Head: Atraumatic. Nose: No congestion/rhinnorhea. Mouth/Throat: Mucous membranes are moist.  Oropharynx non-erythematous. Neck: No stridor.  No cervical spine tenderness to palpation. Hematological/Lymphatic/Immunilogical: No cervical lymphadenopathy. Cardiovascular: Normal rate, regular rhythm. Grossly normal heart sounds.  Good peripheral circulation. Respiratory: Normal respiratory effort.  No retractions. Lungs CTAB. Gastrointestinal: Soft and nontender. No distention. No abdominal bruits. No CVA tenderness. Genitourinary: Deferred Musculoskeletal: No lower extremity tenderness nor edema.  No joint  effusions. Neurologic:  Normal speech and language. No gross focal neurologic deficits are appreciated. No gait instability. Skin:  Skin is warm, dry and intact. No rash noted. Psychiatric: Mood and affect are normal. Speech and behavior are normal.  ____________________________________________   LABS _ _       Component Ref Range & Units 8 d ago (01/31/22) 1 yr ago (12/17/20) 1 yr ago (02/19/20)  Color, UA  Light Yellow  Light Yellow CM  Pale Yellow   Clarity, UA  Clear  Clear  Clear   Glucose, UA Negative Negative  Negative  Negative   Bilirubin, UA  Negative  Negative  neg   Ketones, UA  Negative  Negative  neg   Spec Grav, UA 1.010 - 1.025 1.020  >=1.030 Abnormal   1.015   Blood, UA  Negative  Positive CM  neg   pH, UA 5.0 - 8.0 5.5  6.0  7.0   Protein, UA Negative Negative  Negative  Negative   Urobilinogen, UA 0.2 or 1.0 E.U./dL 0.2  0.2  0.2   Nitrite, UA  Negative  Negative  neg   Leukocytes, UA Negative Negative  Negative  Negative   Appearance     Clear   Odor               Specimen Collected: 01/31/22 10:53 Last Resulted: 01/31/22 10:53      Lab Flowsheet    Order Details    View Encounter    Lab and Collection Details    Routing    Result History    View Encounter Conversation      CM=Additional comments      Result Care Coordination   Patient Communication   Add Comments   Seen Back to Top       Other Results from 01/31/2022   Contains abnormal data CMP12+LP+TP+TSH+6AC+CBC/D/Plt Order: [id number] Status: Final result    Visible to patient: Yes (seen)    Next appt: 05/11/2022 at 08:15 AM in [facility] ([facility] NURSE)    Dx: Routine adult health maintenance    0 Result Notes           Component Ref Range & Units 8 d ago (01/31/22) 10 mo ago (04/14/21) 1 yr ago (12/17/20) 1 yr ago (09/01/20) 1 yr ago (09/01/20) 1 yr ago (02/20/20) 1 yr ago (02/19/20)  Glucose 70 - 99 mg/dL 102 High   109 High  R  107 High  R  104 High  R    111 High  R   Uric Acid 3.8  - 8.4 mg/dL 6.4   6.2 CM     5.8 CM   Comment:            Therapeutic target for gout patients: <6.0  BUN 6 - 20 mg/dL _0 Creatinine, Ser 0.76 - 1.27 mg/dL 1.21  1.11  1.22     1.12   eGFR >59 mL/min/1.73 78  87  78       BUN/Creatinine Ratio 9 - _1 Sodium  134 - 144 mmol/L 137  137  139     139   Potassium 3.5 - 5.2 mmol/L 4.9  5.1  4.5     4.5   Chloride 96 - 106 mmol/L 100  98  101     96   Calcium 8.7 - 10.2 mg/dL 9.9  10.2  9.6     9.6   Phosphorus 2.8 - 4.1 mg/dL 3.6   3.6     3.2   Total Protein 6.0 - 8.5 g/dL 7.3   6.9     7.5   Albumin 4.0 - 5.0 g/dL 4.8   4.7     5.2 High    Globulin, Total 1.5 - 4.5 g/dL 2.5   2.2     2.3   Albumin/Globulin Ratio 1.2 - 2.2 1.9   2.1     2.3 High    Bilirubin Total 0.0 - 1.2 mg/dL 0.3   0.4     0.6   Alkaline Phosphatase 44 - 121 IU/L 89   90     90 R   LDH 121 - 224 IU/L 175   178     186   AST 0 - 40 IU/L 25   34     31   ALT 0 - 44 IU/L 52 High    54 High      59 High    GGT 0 - 65 IU/L 51   45   32   50   Iron 38 - 169 ug/dL 85   84     114   Cholesterol, Total 100 - 199 mg/dL 217 High    165    195  205 High    Triglycerides 0 - 149 mg/dL 165 High    145    107  119   HDL >39 mg/dL 45   44    45  50   VLDL Cholesterol Cal 5 - 40 mg/dL _0 LDL Chol Calc (NIH) 0 - 99 mg/dL 142 High    95    131 High   134 High    Chol/HDL Ratio 0.0 - 5.0 ratio 4.8   3.8 CM    4.3 CM  4.1 CM   Comment:                                   T. Chol/HDL Ratio                                              Men  Women                                1/2 Avg.Risk  3.4    3.3                                    Avg.Risk  5.0    4.4                                 2X Avg.Risk  9.6  7.1                                 3X Avg.Risk 23.4   11.0   Estimated CHD Risk 0.0 - 1.0 times avg. 1.0   0.7 CM     0.8 CM   Comment: The CHD Risk is based on the T. Chol/HDL ratio. Other  factors affect CHD Risk such as  hypertension, smoking,  diabetes, severe obesity, and family history of  premature CHD.   TSH 0.450 - 4.500 uIU/mL 1.560   2.100     2.380   T4, Total 4.5 - 12.0 ug/dL 5.3   6.0     6.9   T3 Uptake Ratio 24 - 39 % 22 Low    27     22 Low    Free Thyroxine Index 1.2 - 4.9 1.2   1.6     1.5   WBC 3.4 - 10.8 x10E3/uL 7.5   5.9     6.3   RBC 4.14 - 5.80 x10E6/uL 4.75   4.70     4.84   Hemoglobin 13.0 - 17.7 g/dL 14.2   14.0     14.7   Hematocrit 37.5 - 51.0 % 40.9   41.2     42.2   MCV 79 - 97 fL 86   88     87   MCH 26.6 - 33.0 pg 29.9   29.8     30.4   MCHC 31.5 - 35.7 g/dL 34.7   34.0     34.8   RDW 11.6 - 15.4 % 13.2   13.8     14.1   Platelets 150 - 450 x10E3/uL 331   278     283   Neutrophils Not Estab. % 53   55     54   Lymphs Not Estab. % 35   33     33   Monocytes Not Estab. % _0 Eos Not Estab. % _1 Basos Not Estab. % _2 Neutrophils Absolute 1.4 - 7.0 x10E3/uL 4.0   3.3     3.4   Lymphocytes Absolute 0.7 - 3.1 x10E3/uL 2.6   1.9     2.1   Monocytes Absolute 0.1 - 0.9 x10E3/uL 0.6   0.5     0.6   EOS (ABSOLUTE) 0.0 - 0.4 x10E3/uL 0.2   0.2     0.1   Basophils Absolute 0.0 - 0.2 x10E3/uL 0.1   0.0     0.1   Immature Granulocytes Not Estab. % 0   0     0   Immature Grans (            __________________________________________  ___________________________________________    ____________________________________________   INITIAL IMPRESSION / ASSESSMENT AND PLAN  As part of my medical decision making, I reviewed the following data within the electronic MEDICAL RECORD NUMBER      Discussed lab results with patient.  Discussed rationale for compliance with medication for lipid control.  Patient will follow-up in 3 months fasting lipid panel.     ____________________________________________   FINAL CLINICAL IMPRESSION Well exam   ED Discharge Orders     None        Note:  This document  was prepared using TEFL teacher and may include unintentional dictation errors.

## 2022-02-08 NOTE — Progress Notes (Signed)
Pt presents today to complete physical, Pt denies any issues or concerns at this time/CL,RMA 

## 2022-05-11 ENCOUNTER — Other Ambulatory Visit: Payer: Self-pay

## 2022-05-11 DIAGNOSIS — E782 Mixed hyperlipidemia: Secondary | ICD-10-CM

## 2022-05-11 NOTE — Progress Notes (Signed)
Lipid panel collected as ordered without difficulty.

## 2022-05-12 LAB — LIPID PANEL
Chol/HDL Ratio: 6.1 ratio — ABNORMAL HIGH (ref 0.0–5.0)
Cholesterol, Total: 267 mg/dL — ABNORMAL HIGH (ref 100–199)
HDL: 44 mg/dL (ref >39)
LDL Chol Calc (NIH): 176 mg/dL — ABNORMAL HIGH (ref 0–99)
Triglycerides: 245 mg/dL — ABNORMAL HIGH (ref 0–149)
VLDL Cholesterol Cal: 47 mg/dL — ABNORMAL HIGH (ref 5–40)

## 2022-05-30 ENCOUNTER — Ambulatory Visit: Payer: Self-pay | Admitting: Physician Assistant

## 2022-05-30 ENCOUNTER — Encounter: Payer: Self-pay | Admitting: Physician Assistant

## 2022-05-30 VITALS — BP 124/100 | HR 88 | Temp 98.0°F | Resp 16 | Ht 68.0 in | Wt 226.0 lb

## 2022-05-30 DIAGNOSIS — E782 Mixed hyperlipidemia: Secondary | ICD-10-CM

## 2022-05-30 NOTE — Progress Notes (Signed)
States he has been eating more fast food over the last 3 months.  AMD

## 2022-05-30 NOTE — Progress Notes (Signed)
   Subjective: Hyperlipidemia    Patient ID: Henry Powell, male    DOB: Mar 22, 1981, 41 y.o.   MRN: [id number]  HPI  Patient presents for 31-month follow-up secondary to hyperlipidemia.  On visit 3 months ago patient admits to noncompliance of statin medication.  Patient state he started taking medicine at his last clinic visit.  Patient also admits to increased fast food meals.  Patient is also gained 8 pounds since last visit.  Cholesterol and triglycerides has increased to 267 and 245 respectively.  Previous readings were 217 and 165 respectively.  Review of Systems Alcohol abuse, anxiety, B12 deficiency, and hyperlipidemia.    Objective:   Physical Exam  BP is 124/100, pulse 88, respirations 16, temperature 98, patient 96% O2 sat on room air.  Patient weighs 226 pounds and BMI is 34.36.  As mentioned above there is a 8 pound weight gain in the last 3 months.      Assessment & Plan: Hyperlipidemia,  Patient advised to continue taking Crestor 40 mg and follow-up this week for another fasting lipid profile to validate last reading.

## 2022-05-31 ENCOUNTER — Other Ambulatory Visit: Payer: Self-pay

## 2022-05-31 DIAGNOSIS — E782 Mixed hyperlipidemia: Secondary | ICD-10-CM

## 2022-05-31 NOTE — Progress Notes (Signed)
Pt presents today to complete lipid panel.

## 2022-06-01 LAB — LIPID PANEL
Chol/HDL Ratio: 5 ratio (ref 0.0–5.0)
Cholesterol, Total: 216 mg/dL — ABNORMAL HIGH (ref 100–199)
HDL: 43 mg/dL (ref >39)
LDL Chol Calc (NIH): 140 mg/dL — ABNORMAL HIGH (ref 0–99)
Triglycerides: 181 mg/dL — ABNORMAL HIGH (ref 0–149)
VLDL Cholesterol Cal: 33 mg/dL (ref 5–40)

## 2022-09-27 ENCOUNTER — Other Ambulatory Visit: Payer: Self-pay

## 2022-09-27 ENCOUNTER — Encounter: Payer: Self-pay | Admitting: Physician Assistant

## 2022-09-27 ENCOUNTER — Ambulatory Visit: Payer: Self-pay | Admitting: Physician Assistant

## 2022-09-27 DIAGNOSIS — R52 Pain, unspecified: Secondary | ICD-10-CM

## 2022-09-27 DIAGNOSIS — R051 Acute cough: Secondary | ICD-10-CM

## 2022-09-27 DIAGNOSIS — R509 Fever, unspecified: Secondary | ICD-10-CM

## 2022-09-27 DIAGNOSIS — J09X2 Influenza due to identified novel influenza A virus with other respiratory manifestations: Secondary | ICD-10-CM

## 2022-09-27 LAB — POCT INFLUENZA A/B
Influenza A, POC: POSITIVE — AB
Influenza B, POC: NEGATIVE

## 2022-09-27 LAB — POC COVID19 BINAXNOW: SARS Coronavirus 2 Ag: NEGATIVE

## 2022-09-27 MED ORDER — IBUPROFEN 800 MG PO TABS: 800.0000 mg | ORAL_TABLET | Freq: Three times a day (TID) | ORAL | 0 refills | Status: DC | PRN

## 2022-09-27 MED ORDER — PROMETHAZINE-DM 6.25-15 MG/5ML PO SYRP: 5.0000 mL | ORAL_SOLUTION | Freq: Four times a day (QID) | ORAL | 0 refills | Status: DC | PRN

## 2022-09-27 MED ORDER — OSELTAMIVIR PHOSPHATE 75 MG PO CAPS: 75.0000 mg | ORAL_CAPSULE | Freq: Two times a day (BID) | ORAL | 0 refills | Status: DC

## 2022-09-27 NOTE — Progress Notes (Signed)
S/Sx started yesterday: Fever 101 F - earlier this morning Cough - productive (green phlegm) Body aches Fatigue SOB - at rest  Denies N/V/D  Hasn't used any OTC meds for symptoms  No flu vaccine this year  States worst symptom is the coughing  Rapid Flu Test positive for influenza A  AMD

## 2022-09-27 NOTE — Progress Notes (Signed)
   Subjective: Viral illness    Patient ID: Henry Powell, male    DOB: 1981/05/09, 42 y.o.   MRN: [id number]  HPI Patient complain of cough, body aches, fever/chills and fatigue for 1 day.  Denies recent travel or known contact with COVID-19.  Patient test positive for influenza A this morning.   Review of Systems Negative except for above complaint    Objective:   Physical Exam  Deferred secondary to telephonic encounter      Assessment & Plan: Influenza   Patient given a prescription for Tamiflu, Phenergan DM, and ibuprofen.  Patient given a work note for 2 days and advised to follow-up if no improvement or worsening complaint.

## 2022-09-27 NOTE — Progress Notes (Signed)
Pt tested postive for flu today with symptoms of Flu positive - shortness of breath, miscolored mucus, cough, fever 101, body aches, fatigue x1 day

## 2022-11-06 ENCOUNTER — Other Ambulatory Visit: Payer: Self-pay | Admitting: Medical

## 2022-11-07 ENCOUNTER — Telehealth: Payer: Self-pay | Admitting: Cardiology

## 2022-11-07 NOTE — Telephone Encounter (Signed)
*  STAT* If patient is at the pharmacy, call can be transferred to refill team.   1. Which medications need to be refilled? (please list name of each medication and dose if known)  irbesartan (AVAPRO) 150 MG tablet  2. Which pharmacy/location (including street and city if local pharmacy) is medication to be sent to? [facility] [place], [address] AT [facility] [address]    3. Do they need a 30 day or 90 day supply?  90 day supply

## 2022-11-07 NOTE — Telephone Encounter (Signed)
irbesartan (AVAPRO) 150 MG tablet 120 tablet 1 11/07/2022    Sig: TAKE 2 TABLETS(300 MG) BY MOUTH DAILY   Sent to pharmacy as: irbesartan (AVAPRO) 150 MG tablet   Notes to Pharmacy: PLEASE SCHEDULE AN APPT FOR FUTURE REFILLS   E-Prescribing Status: Receipt confirmed by pharmacy (11/07/2022  9:00 AM EST)   Renewals  Renewal provider: Antony Madura, [place], [place]

## 2022-11-29 ENCOUNTER — Encounter: Payer: Self-pay | Admitting: Physician Assistant

## 2022-11-29 ENCOUNTER — Ambulatory Visit: Payer: Self-pay

## 2022-11-29 ENCOUNTER — Ambulatory Visit: Payer: Self-pay | Admitting: Physician Assistant

## 2022-11-29 VITALS — BP 132/105 | HR 93 | Temp 97.3°F | Resp 14 | Wt 225.0 lb

## 2022-11-29 DIAGNOSIS — Z0000 Encounter for general adult medical examination without abnormal findings: Secondary | ICD-10-CM

## 2022-11-29 DIAGNOSIS — L282 Other prurigo: Secondary | ICD-10-CM

## 2022-11-29 LAB — POCT URINALYSIS DIPSTICK
Bilirubin, UA: NEGATIVE
Blood, UA: NEGATIVE
Glucose, UA: NEGATIVE
Ketones, UA: NEGATIVE
Leukocytes, UA: NEGATIVE
Nitrite, UA: NEGATIVE
Protein, UA: NEGATIVE
Spec Grav, UA: >=1.03 — AB (ref 1.010–1.025)
Urobilinogen, UA: 0.2 E.U./dL
pH, UA: 5.5 (ref 5.0–8.0)

## 2022-11-29 MED ORDER — HYDROXYZINE PAMOATE 25 MG PO CAPS: 25.0000 mg | ORAL_CAPSULE | Freq: Three times a day (TID) | ORAL | 0 refills | Status: DC | PRN

## 2022-11-29 MED ORDER — HYDROCORTISONE VALERATE 0.2 % EX CREA: 1.0000 | TOPICAL_CREAM | Freq: Two times a day (BID) | CUTANEOUS | 0 refills | Status: DC

## 2022-11-29 NOTE — Progress Notes (Signed)
Stated c/o rash bilateral forearms and neck where apparent sun exposure approximately 6 days ago.  Noted raised bumpy red rash, no drainage or pustules at this time.  Stated this is a new issue and he was using Calamine lotion to help the itching.  No new meds or changes that he is aware of.

## 2022-11-29 NOTE — Progress Notes (Signed)
   Subjective:Rash    Patient ID: Henry Powell, male    DOB: 10-16-1980, 42 y.o.   MRN: [id number]  HPI  Patient presents for rash that developed 6 d ago over bilateral forearms, neck, and chest that is erythematous, blanchable, and with raised bumps. Patient reports it to be itchy and has tried calamine lotion without any symptom relief. 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 Rash appears to be over sun exposed skin.   Review of Systems Skin: pruritus, erythema     Objective:   Physical Exam Rash covers bilateral forearms, back of neck, and top portion of chest. It is erythematous, blanchable, with generalized bumps and excoriated marks from patient scratching. Otherwise, skin intact without bruising, open wounds, or visible lesions.      Assessment & Plan:Sun Rash   Rash looks to be result of prolonged sun exposure. Steroid topical cream and oral antihistamine ordered. Patient advised to wear protective clothing while outside and to notify if rash not getting better or worsening.

## 2022-11-30 LAB — CMP12+LP+TP+TSH+6AC+PSA+CBC…
ALT: 37 IU/L (ref 0–44)
AST: 21 IU/L (ref 0–40)
Albumin/Globulin Ratio: 2.1 (ref 1.2–2.2)
Albumin: 5.1 g/dL (ref 4.1–5.1)
Alkaline Phosphatase: 80 IU/L (ref 44–121)
BUN/Creatinine Ratio: 13 (ref 9–20)
BUN: 14 mg/dL (ref 6–24)
Basophils Absolute: 0 10*3/uL (ref 0.0–0.2)
Basos: 1 %
Bilirubin Total: 0.4 mg/dL (ref 0.0–1.2)
Calcium: 9.4 mg/dL (ref 8.7–10.2)
Chloride: 100 mmol/L (ref 96–106)
Chol/HDL Ratio: 4.2 ratio (ref 0.0–5.0)
Cholesterol, Total: 204 mg/dL — ABNORMAL HIGH (ref 100–199)
Creatinine, Ser: 1.11 mg/dL (ref 0.76–1.27)
EOS (ABSOLUTE): 0.1 10*3/uL (ref 0.0–0.4)
Eos: 2 %
Estimated CHD Risk: 0.8 times avg. (ref 0.0–1.0)
Free Thyroxine Index: 1.5 (ref 1.2–4.9)
GGT: 36 IU/L (ref 0–65)
Globulin, Total: 2.4 g/dL (ref 1.5–4.5)
Glucose: 102 mg/dL — ABNORMAL HIGH (ref 70–99)
HDL: 49 mg/dL (ref >39)
Hematocrit: 42.6 % (ref 37.5–51.0)
Hemoglobin: 14.4 g/dL (ref 13.0–17.7)
Immature Grans (Abs): 0 10*3/uL (ref 0.0–0.1)
Immature Granulocytes: 0 %
Iron: 88 ug/dL (ref 38–169)
LDH: 179 IU/L (ref 121–224)
LDL Chol Calc (NIH): 127 mg/dL — ABNORMAL HIGH (ref 0–99)
Lymphocytes Absolute: 2.2 10*3/uL (ref 0.7–3.1)
Lymphs: 36 %
MCH: 29.7 pg (ref 26.6–33.0)
MCHC: 33.8 g/dL (ref 31.5–35.7)
MCV: 88 fL (ref 79–97)
Monocytes Absolute: 0.5 10*3/uL (ref 0.1–0.9)
Monocytes: 7 %
Neutrophils Absolute: 3.4 10*3/uL (ref 1.4–7.0)
Neutrophils: 54 %
Phosphorus: 3.3 mg/dL (ref 2.8–4.1)
Platelets: 313 10*3/uL (ref 150–450)
Potassium: 4.7 mmol/L (ref 3.5–5.2)
Prostate Specific Ag, Serum: 0.5 ng/mL (ref 0.0–4.0)
RBC: 4.85 x10E6/uL (ref 4.14–5.80)
RDW: 13.9 % (ref 11.6–15.4)
Sodium: 137 mmol/L (ref 134–144)
T3 Uptake Ratio: 27 % (ref 24–39)
T4, Total: 5.7 ug/dL (ref 4.5–12.0)
TSH: 2.09 u[IU]/mL (ref 0.450–4.500)
Total Protein: 7.5 g/dL (ref 6.0–8.5)
Triglycerides: 159 mg/dL — ABNORMAL HIGH (ref 0–149)
Uric Acid: 6.5 mg/dL (ref 3.8–8.4)
VLDL Cholesterol Cal: 28 mg/dL (ref 5–40)
WBC: 6.3 10*3/uL (ref 3.4–10.8)
eGFR: 86 mL/min/{1.73_m2} (ref >59)

## 2022-11-30 LAB — VITAMIN B12: Vitamin B-12: 597 pg/mL (ref 232–1245)

## 2022-12-06 ENCOUNTER — Ambulatory Visit: Payer: Self-pay | Admitting: Physician Assistant

## 2022-12-06 ENCOUNTER — Encounter: Payer: Self-pay | Admitting: Physician Assistant

## 2022-12-06 VITALS — BP 127/95 | HR 98 | Temp 98.4°F | Resp 12 | Ht 68.0 in | Wt 224.0 lb

## 2022-12-06 DIAGNOSIS — Z0000 Encounter for general adult medical examination without abnormal findings: Secondary | ICD-10-CM

## 2022-12-06 DIAGNOSIS — W57XXXA Bitten or stung by nonvenomous insect and other nonvenomous arthropods, initial encounter: Secondary | ICD-10-CM

## 2022-12-06 MED ORDER — MUPIROCIN CALCIUM 2 % EX CREA: 1.0000 | TOPICAL_CREAM | Freq: Two times a day (BID) | CUTANEOUS | 0 refills | Status: DC

## 2022-12-06 MED ORDER — SULFAMETHOXAZOLE-TRIMETHOPRIM 800-160 MG PO TABS: 1.0000 | ORAL_TABLET | Freq: Two times a day (BID) | ORAL | 0 refills | Status: DC

## 2022-12-06 MED ORDER — MUPIROCIN 2 % EX OINT: 1.0000 | TOPICAL_OINTMENT | Freq: Two times a day (BID) | CUTANEOUS | 0 refills | Status: DC

## 2022-12-06 NOTE — Progress Notes (Signed)
[facility] [place] [facility]   ____________________________________________   None    (approximate)  I have reviewed the triage vital signs and the nursing notes.   HISTORY  Chief Complaint Annual Exam    HPI Henry Powell is a 42 y.o. male patient presents annual physical exam.  Patient was concerned but insect bite to the right buttocks 3 days ago.  The area is red and painful.  No drainage.         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 Authorizing Provider  aspirin 81 MG chewable tablet Chew by mouth daily.   Yes [provider]  cyanocobalamin 1000 MCG tablet Take 1 tablet (1,000 mcg total) by mouth daily. 11/14/19  Yes McCue, Janett Billow, NP  hydrocortisone valerate cream (WESTCORT) 0.2 % Apply 1 Application topically 2 (two) times daily. 11/29/22  Yes Sable Feil, PA-C  irbesartan (AVAPRO) 150 MG tablet TAKE 2 TABLETS(300 MG) BY MOUTH DAILY 11/07/22  Yes Agbor-Etang, Aaron Edelman, MD  Omega 3 1000 MG CAPS Take 1 capsule by mouth daily.   Yes [provider]  rosuvastatin (CRESTOR) 40 MG tablet Take 1 tablet (40 mg total) by mouth daily. 01/11/22  Yes Letitia Neri L, PA-C  sertraline (ZOLOFT) 100 MG tablet Take 1 tablet (100 mg total) by mouth daily. 02/08/22  Yes Sable Feil, PA-C  hydrOXYzine (VISTARIL) 25 MG capsule  Take 1 capsule (25 mg total) by mouth every 8 (eight) hours as needed. Patient not taking: Reported on 12/06/2022 11/29/22   Sable Feil, PA-C    Allergies Patient has no known allergies.  Family History  Problem Relation Age of Onset   Hypertension Mother    Diabetes Mother    Hypertension Father    Stroke Paternal Uncle    Stroke Paternal Uncle     Social History Social History   Tobacco Use   Smoking status: Never   Smokeless tobacco: Former    Types: Chew    Quit date: 08/18/2019  Substance Use Topics   Alcohol use: Yes    Alcohol/week: 56.0 standard drinks of alcohol    Types: 56 Cans of beer per week   Drug use: Never    Review of Systems  Constitutional: No fever/chills Eyes: No visual changes. ENT: No sore throat. Cardiovascular: Denies chest pain. Respiratory: Denies shortness of breath. Gastrointestinal: No abdominal pain.  No nausea, no vomiting.  No diarrhea.  No constipation. Genitourinary: Negative for dysuria. Musculoskeletal: Negative for back pain. Skin: Negative for rash.  Insect bite right buttocks Neurological: Negative for headaches, focal weakness or numbness. Psychiatric: Anxiety Endocrine: Hypertension   ____________________________________________   PHYSICAL EXAM:  VITAL SIGNS: BP is 127/95, pulse 98, respiration 12, temperature 90.4, patient 95% O2 sat on room air.  Patient weighs  224 pounds and BMI is 34.06. Constitutional: Alert and oriented. Well appearing and in no acute distress. Eyes: Conjunctivae are normal. PERRL. EOMI. Head: Atraumatic. Nose: No congestion/rhinnorhea. Mouth/Throat: Mucous membranes are moist.  Oropharynx non-erythematous. Neck: No stridor.  No cervical spine tenderness to palpation. Hematological/Lymphatic/Immunilogical: No cervical lymphadenopathy. Cardiovascular: Normal rate, regular rhythm. Grossly normal heart sounds.  Good peripheral circulation. Respiratory: Normal respiratory effort.  No  retractions. Lungs CTAB. Gastrointestinal: Soft and nontender. No distention. No abdominal bruits. No CVA tenderness. Genitourinary: Deferred Musculoskeletal: No lower extremity tenderness nor edema.  No joint effusions. Neurologic:  Normal speech and language. No gross focal neurologic deficits are appreciated. No gait instability. Skin:  Skin is warm, dry and intact.  Erythematous maculopapular lesion right buttocks.   Psychiatric: Mood and affect are normal. Speech and behavior are normal.  ____________________________________________   LABS  Ref Range & Units 7 d ago 10 mo ago 1 yr ago 2 yr ago  Color, UA Yellow Light Yellow Light Yellow CM Pale Yellow  Clarity, UA Clear Clear Clear Clear  Glucose, UA Negative Negative Negative Negative Negative  Bilirubin, UA Negative Negative Negative neg  Ketones, UA Negative Negative Negative neg  Spec Grav, UA 1.010 - 1.025 >=1.030 Abnormal  1.020 >=1.030 Abnormal  1.015  Blood, UA Negative Negative Positive CM neg  pH, UA 5.0 - 8.0 5.5 5.5 6.0 7.0  Protein, UA Negative Negative Negative Negative Negative  Urobilinogen, UA 0.2 or 1.0 E.U./dL 0.2 0.2 0.2 0.2  Nitrite, UA Negative Negative Negative neg  Leukocytes, UA Negative Negative Negative Negative Negative  Appearance    Clear  Odor                                  Component Ref Range & Units 7 d ago (11/29/22) 6 mo ago (05/31/22) 6 mo ago (05/11/22) 10 mo ago (01/31/22) 1 yr ago (04/14/21) 1 yr ago (12/17/20) 2 yr ago (09/01/20) 2 yr ago (09/01/20)  Glucose 70 - 99 mg/dL 102 High    102 High  109 High  R 107 High  R 104 High  R   Uric Acid 3.8 - 8.4 mg/dL 6.5   6.4 CM  6.2 CM    Comment:            Therapeutic target for gout patients: <6.0  BUN 6 - 24 mg/dL 14   14 R 17 R 12 R    Creatinine, Ser 0.76 - 1.27 mg/dL 1.11   1.21 1.11 1.22    eGFR >59 mL/min/1.73 86   78 87 78    BUN/Creatinine Ratio 9 - 20 13   12 15 10     Sodium 134 - 144 mmol/L 137   137 137  139    Potassium 3.5 - 5.2 mmol/L 4.7   4.9 5.1 4.5    Chloride 96 - 106 mmol/L 100   100 98 101    Calcium 8.7 - 10.2 mg/dL 9.4   9.9 10.2 9.6    Phosphorus 2.8 - 4.1 mg/dL 3.3   3.6  3.6    Total Protein 6.0 - 8.5 g/dL 7.5   7.3  6.9    Albumin 4.1 - 5.1 g/dL 5.1   4.8 R  4.7 R    Globulin, Total 1.5 - 4.5 g/dL 2.4   2.5  2.2    Albumin/Globulin Ratio 1.2 - 2.2 2.1   1.9  2.1  Bilirubin Total 0.0 - 1.2 mg/dL 0.4   0.3  0.4    Alkaline Phosphatase 44 - 121 IU/L 80   89  90    LDH 121 - 224 IU/L 179   175  178    AST 0 - 40 IU/L 21   25  34    ALT 0 - 44 IU/L 37   52 High   54 High     GGT 0 - 65 IU/L 36   51  45  32  Iron 38 - 169 ug/dL 88   85  84    Cholesterol, Total 100 - 199 mg/dL 204 High  216 High  267 High  217 High   165    Triglycerides 0 - 149 mg/dL 159 High  181 High  245 High  165 High   145    HDL >39 mg/dL 49 43 44 45  44    VLDL Cholesterol Cal 5 - 40 mg/dL 28 33 47 High  30  26    LDL Chol Calc (NIH) 0 - 99 mg/dL 127 High  140 High  176 High  142 High   95    Chol/HDL Ratio 0.0 - 5.0 ratio 4.2 5.0 CM 6.1 High  CM 4.8 CM  3.8 CM    Comment:                                   T. Chol/HDL Ratio                                             Men  Women                               1/2 Avg.Risk  3.4    3.3                                   Avg.Risk  5.0    4.4                                2X Avg.Risk  9.6    7.1                                3X Avg.Risk 23.4   11.0  Estimated CHD Risk 0.0 - 1.0 times avg. 0.8   1.0 CM  0.7 CM    Comment: The CHD Risk is based on the T. Chol/HDL ratio. Other factors affect CHD Risk such as hypertension, smoking, diabetes, severe obesity, and family history of premature CHD.  TSH 0.450 - 4.500 uIU/mL 2.090   1.560  2.100    T4, Total 4.5 - 12.0 ug/dL 5.7   5.3  6.0    T3 Uptake Ratio 24 - 39 % 27   22 Low   27    Free Thyroxine Index 1.2 - 4.9 1.5   1.2  1.6    Prostate Specific Ag, Serum 0.0 - 4.0 ng/mL  0.5         Comment: Roche ECLIA methodology. According  to the American Urological Association, Serum PSA should decrease and remain at undetectable levels after radical prostatectomy. The AUA defines biochemical recurrence as an initial PSA value 0.2 ng/mL or greater followed by a subsequent confirmatory PSA value 0.2 ng/mL or greater. Values obtained with different assay methods or kits cannot be used interchangeably. Results cannot be interpreted as absolute evidence of the presence or absence of malignant disease.  WBC 3.4 - 10.8 x10E3/uL 6.3   7.5  5.9    RBC 4.14 - 5.80 x10E6/uL 4.85   4.75  4.70    Hemoglobin 13.0 - 17.7 g/dL 14.4   14.2  14.0    Hematocrit 37.5 - 51.0 % 42.6   40.9  41.2    MCV 79 - 97 fL 88   86  88    MCH 26.6 - 33.0 pg 29.7   29.9  29.8    MCHC 31.5 - 35.7 g/dL 33.8   34.7  34.0    RDW 11.6 - 15.4 % 13.9   13.2  13.8    Platelets 150 - 450 x10E3/uL 313   331  278    Neutrophils Not Estab. % 54   53  55    Lymphs Not Estab. % 36   35  33    Monocytes Not Estab. % 7   8  8     Eos Not Estab. % 2   3  3     Basos Not Estab. % 1   1  1     Neutrophils Absolute 1.4 - 7.0 x10E3/uL 3.4   4.0  3.3    Lymphocytes Absolute 0.7 - 3.1 x10E3/uL 2.2   2.6  1.9    Monocytes Absolute 0.1 - 0.9 x10E3/uL 0.5   0.6  0.5    EOS (ABSOLUTE) 0.0 - 0.4 x10E3/uL 0.1   0.2  0.2    Basophils Absolute 0.0 - 0.2 x10E3/uL 0.0   0.1  0.0    Immature Granulocytes Not Estab. % 0   0  0                   ____________________________________________  EKG  Sinus rhythm at 72 bpm ____________________________________________    ____________________________________________   INITIAL IMPRESSION / ASSESSMENT AND PLAN   As part of my medical decision making, I reviewed the following data within the electronic MEDICAL RECORD NUMBER      No acute findings on physical exam or EKG.  Discussed lab results showed mildly elevated cholesterol and triglycerides.  Advised  lifestyle modification and diet and follow-up in 6 months.  Patient given prescription for Bactroban and Bactrim DS for infected insect bite.        ____________________________________________   FINAL CLINICAL IMPRESSION Well exam   ED Discharge Orders     None        Note:  This document was prepared using Dragon voice recognition software and may include unintentional dictation errors.

## 2022-12-06 NOTE — Progress Notes (Signed)
Insect bite on right buttock - noticed it Saturday Tender to touch States OOW yesterday because it made him nauseated No vomiting - able to keep food down.

## 2023-02-08 ENCOUNTER — Other Ambulatory Visit: Payer: Self-pay

## 2023-02-08 DIAGNOSIS — E7801 Familial hypercholesterolemia: Secondary | ICD-10-CM

## 2023-02-08 MED ORDER — ROSUVASTATIN CALCIUM 40 MG PO TABS: 40.0000 mg | ORAL_TABLET | Freq: Every day | ORAL | 3 refills | Status: DC

## 2023-02-21 ENCOUNTER — Encounter: Payer: Self-pay | Admitting: Adult Health

## 2023-02-21 ENCOUNTER — Ambulatory Visit: Payer: 59 | Admitting: Adult Health

## 2023-02-21 VITALS — BP 120/87 | HR 83 | Temp 98.3°F | Resp 14 | Ht 68.0 in | Wt 215.0 lb

## 2023-02-21 DIAGNOSIS — R21 Rash and other nonspecific skin eruption: Secondary | ICD-10-CM

## 2023-02-21 MED ORDER — VALACYCLOVIR HCL 1 G PO TABS: 1000.0000 mg | ORAL_TABLET | Freq: Two times a day (BID) | ORAL | 0 refills | Status: DC

## 2023-02-21 NOTE — Progress Notes (Signed)
[facility] [address], [place] [address]   Office Visit Note  Patient Name: Henry Powell  [id number]  [id number]  Date of Service: 02/21/2023  Chief Complaint  Patient presents with   Insect Bite     HPI Pt is here for a acute visit.  He reports waking up in the middle of the night 2 nights ago, feeling "off".  He reports nausea, chills, fatigue.  He also states he had some pain on the back of his right leg near buttocks that he noticed when he sat on it.  Over the next 24 hours he never vomited, had 1-2 episodes of diarrhea but was able to eat soup and stay hydrated.  The discomfort in his leg has remained, and now he has a rash there.  He is concerned for spider bite, because he mowed grass 2 days ago.  Denies finding any ticks recently. No new medications.      Current Medication:  Outpatient Encounter Medications as of 02/21/2023  Medication Sig   aspirin 81 MG chewable tablet Chew by mouth daily.   cyanocobalamin 1000 MCG tablet Take 1 tablet (1,000 mcg total) by mouth daily.   irbesartan (AVAPRO) 150 MG tablet TAKE 2 TABLETS(300 MG) BY MOUTH DAILY   Omega 3 1000 MG CAPS Take 1 capsule by mouth daily.   rosuvastatin (CRESTOR) 40 MG tablet Take 1 tablet (40 mg total) by mouth daily.   sertraline (ZOLOFT) 100 MG tablet Take 1 tablet (100 mg total) by mouth daily.   valACYclovir (VALTREX) 1000 MG tablet Take 1 tablet (1,000 mg total) by mouth 2 (two) times daily.   [DISCONTINUED] hydrocortisone valerate cream (WESTCORT) 0.2 % Apply 1 Application topically 2 (two) times daily.   [DISCONTINUED] hydrOXYzine (VISTARIL) 25 MG capsule Take 1 capsule (25 mg total) by mouth every 8 (eight) hours as needed. (Patient not taking: Reported on 12/06/2022)   [DISCONTINUED] mupirocin cream (BACTROBAN) 2 % Apply 1 Application topically 2 (two) times daily.   [DISCONTINUED] mupirocin ointment (BACTROBAN) 2 % Apply 1 Application topically 2 (two) times daily.   [DISCONTINUED]  sulfamethoxazole-trimethoprim (BACTRIM DS) 800-160 MG tablet Take 1 tablet by mouth 2 (two) times daily.   No facility-administered encounter medications on file as of 02/21/2023.      Medical History: Past Medical History:  Diagnosis Date   Anxiety state 06/11/2020   Hypertension    Stroke (HCC)      Vital Signs: BP 120/87   Pulse 83   Temp 98.3 F (36.8 C) (Oral)   Resp 14   Ht 5\' 8"  (1.727 m)   Wt 215 lb (97.5 kg)   BMI 32.69 kg/m    Review of Systems  Constitutional:  Positive for chills and fatigue. Negative for fever.  Eyes:  Negative for pain and itching.  Respiratory:  Negative for cough.   Cardiovascular:  Negative for chest pain.  Gastrointestinal:  Positive for diarrhea and nausea. Negative for vomiting.    Physical Exam Vitals reviewed.  Constitutional:      Appearance: Normal appearance.  HENT:     Head: Normocephalic.  Skin:         Comments: Raised, red, cluster, mildly tender rash, appears scaly at this time, No vesicles or blisters noted.   Neurological:     Mental Status: He is alert.    Assessment/Plan: 1. Rash and nonspecific skin eruption Concern for Primary outbreak of shingles, based on patient report.  Nausea and chills have resolved. Rash  is now present.  Take Valtrex as prescribed, Follow up via MyChart messenger if symptoms fail to improve or may return to clinic as needed for worsening symptoms.   - valACYclovir (VALTREX) 1000 MG tablet; Take 1 tablet (1,000 mg total) by mouth 2 (two) times daily.  Dispense: 20 tablet; Refill: 0     General Counseling: Bryann verbalizes understanding of the findings of todays visit and agrees with plan of treatment. I have discussed any further diagnostic evaluation that may be needed or ordered today. We also reviewed his medications today. he has been encouraged to call the office with any questions or concerns that should arise related to todays visit.   No orders of the defined types were placed  in this encounter.   Meds ordered this encounter  Medications   valACYclovir (VALTREX) 1000 MG tablet    Sig: Take 1 tablet (1,000 mg total) by mouth 2 (two) times daily.    Dispense:  20 tablet    Refill:  0    Time spent:15 Minutes    Johnna Acosta AGNP-C Nurse Practitioner

## 2023-02-21 NOTE — Progress Notes (Signed)
Pt presents today with possible spider bite under right buttocks. Pt states he starting having symptoms of diarrhea, chills and nausea Sunday night.

## 2023-03-06 ENCOUNTER — Other Ambulatory Visit: Payer: Self-pay

## 2023-03-06 ENCOUNTER — Telehealth: Payer: Self-pay | Admitting: Cardiology

## 2023-03-06 DIAGNOSIS — F411 Generalized anxiety disorder: Secondary | ICD-10-CM

## 2023-03-06 MED ORDER — SERTRALINE HCL 100 MG PO TABS: 100.0000 mg | ORAL_TABLET | Freq: Every day | ORAL | 3 refills | Status: DC

## 2023-03-06 MED ORDER — IRBESARTAN 150 MG PO TABS: ORAL_TABLET | ORAL | 1 refills | Status: DC

## 2023-03-06 NOTE — Telephone Encounter (Signed)
Requested Prescriptions   Signed Prescriptions Disp Refills   irbesartan (AVAPRO) 150 MG tablet 60 tablet 1    Sig: TAKE 2 TABLETS(300 MG) BY MOUTH DAILY    Authorizing Provider: Debbe Odea    Ordering User: NEWCOMER MCCLAIN, Stevens Magwood L

## 2023-03-06 NOTE — Telephone Encounter (Signed)
*  STAT* If patient is at the pharmacy, call can be transferred to refill team.   1. Which medications need to be refilled? (please list name of each medication and dose if known) irbesartan (AVAPRO) 150 MG tablet   2. Which pharmacy/location (including street and city if local pharmacy) is medication to be sent to?  [id number] - [place], [place] - [address] AT [facility] OF [address]    3. Do they need a 30 day or 90 day supply? 30  Patient has appt 07/30

## 2023-04-11 ENCOUNTER — Ambulatory Visit: Payer: 59 | Attending: Medical | Admitting: Medical

## 2023-04-11 ENCOUNTER — Encounter: Payer: Self-pay | Admitting: Medical

## 2023-04-11 VITALS — BP 132/88 | HR 91 | Ht 68.0 in | Wt 227.6 lb

## 2023-04-11 DIAGNOSIS — E782 Mixed hyperlipidemia: Secondary | ICD-10-CM | POA: Diagnosis not present

## 2023-04-11 DIAGNOSIS — I639 Cerebral infarction, unspecified: Secondary | ICD-10-CM

## 2023-04-11 DIAGNOSIS — I10 Essential (primary) hypertension: Secondary | ICD-10-CM | POA: Diagnosis not present

## 2023-04-11 DIAGNOSIS — E7801 Familial hypercholesterolemia: Secondary | ICD-10-CM | POA: Diagnosis not present

## 2023-04-11 DIAGNOSIS — Z789 Other specified health status: Secondary | ICD-10-CM | POA: Diagnosis not present

## 2023-04-11 MED ORDER — ROSUVASTATIN CALCIUM 40 MG PO TABS: 40.0000 mg | ORAL_TABLET | Freq: Every day | ORAL | 3 refills | Status: DC

## 2023-04-11 MED ORDER — IRBESARTAN 300 MG PO TABS: 300.0000 mg | ORAL_TABLET | Freq: Every day | ORAL | 3 refills | Status: DC

## 2023-04-11 NOTE — Patient Instructions (Signed)
Medication Instructions:  Your physician recommends that you continue on your current medications as directed. Please refer to the Current Medication list given to you today.  *If you need a refill on your cardiac medications before your next appointment, please call your pharmacy*   Lab Work: None ordered today   Testing/Procedures: None ordered today   Follow-Up: At [facility], you and your health needs are our priority.  As part of our continuing mission to provide you with exceptional heart care, we have created designated Provider Care Teams.  These Care Teams include your primary Cardiologist (physician) and Advanced Practice Providers (APPs -  Physician Assistants and Nurse Practitioners) who all work together to provide you with the care you need, when you need it.  We recommend signing up for the patient portal called "MyChart".  Sign up information is provided on this After Visit Summary.  MyChart is used to connect with patients for Virtual Visits (Telemedicine).  Patients are able to view lab/test results, encounter notes, upcoming appointments, etc.  Non-urgent messages can be sent to your provider as well.   To learn more about what you can do with MyChart, go to [email or web address].    Your next appointment:   1 year(s)  Provider:   You may see Debbe Odea, MD or one of the following Advanced Practice Providers on your designated Care Team:   Nicolasa Ducking, NP Eula Listen, PA-C Cadence Fransico Michael, PA-C Charlsie Quest, NP

## 2023-04-11 NOTE — Progress Notes (Signed)
Cardiology Office Note:    Date:  04/11/2023   ID:  Henry Powell, DOB 06/17/1981, MRN [id number]  PCP:  Patient, No Pcp Per  [facility] Cardiologist:  Debbe Odea, MD  [facility] Electrophysiologist:  None   Referring MD: No ref. provider found   Chief Complaint: 1 year follow-up  History of Present Illness:    Henry Powell is a 42 y.o. male with a hx of HTN, HLD, CVA 2020, and ETOH use who presents for follow-up for HTN.   The patient has a h/o of HTN started on clonidine TID but diastolic pressures were still high. He had a stroke in 2020, CT head showed right PICA infarct. Echo 08/2019 showed normal systolic function. EF 50-60%, impaired relaxation.   Patient was last seen in April 2023 and was overall doing better.  He had cut back to 3 beers a day.  Blood pressure was better on irbesartan.  Today, the patient is overall doing well. The patient denies chest pain, SOB, LLE, orthopnea or pnd. He needs refills of ARB and statin. He got a new job. He says he is drinking 3 beers daily.     Past Medical History:  Diagnosis Date   Anxiety state 06/11/2020   Hypertension    Stroke [facility])     Past Surgical History:  Procedure Laterality Date   NO PAST SURGERIES      Current Medications: Current Meds  Medication Sig   aspirin 81 MG chewable tablet Chew by mouth daily.   cyanocobalamin 1000 MCG tablet Take 1 tablet (1,000 mcg total) by mouth daily.   irbesartan (AVAPRO) 150 MG tablet TAKE 2 TABLETS(300 MG) BY MOUTH DAILY   Omega 3 1000 MG CAPS Take 1 capsule by mouth daily.   rosuvastatin (CRESTOR) 40 MG tablet Take 1 tablet (40 mg total) by mouth daily.   sertraline (ZOLOFT) 100 MG tablet Take 1 tablet (100 mg total) by mouth daily.     Allergies:   Patient has no known allergies.   Social History   Socioeconomic History   Marital status: Divorced    Spouse name: Not on file   Number of children: Not on file   Years of education: Not on file   Highest  education level: Not on file  Occupational History   Not on file  Tobacco Use   Smoking status: Never   Smokeless tobacco: Former    Types: Chew    Quit date: 08/18/2019  Substance and Sexual Activity   Alcohol use: Yes    Alcohol/week: 56.0 standard drinks of alcohol    Types: 56 Cans of beer per week   Drug use: Never   Sexual activity: Not on file  Other Topics Concern   Not on file  Social History Narrative   Mr. Genco works, please call after 4:00 pm.   Social Determinants of Health   Financial Resource Strain: Not on file  Food Insecurity: Not on file  Transportation Needs: Not on file  Physical Activity: Not on file  Stress: Not on file  Social Connections: Not on file     Family History: The patient's family history includes Diabetes in his mother; Hypertension in his father and mother; Stroke in his paternal uncle and paternal uncle.  ROS:   Please see the history of present illness.     All other systems reviewed and are negative.  EKGs/Labs/Other Studies Reviewed:    The following studies were reviewed today:  Echo 08/2019  1. Left  ventricular ejection fraction, by visual estimation, is 55 to  60%. The left ventricle has normal function. There is no left ventricular  hypertrophy.   2. Left ventricular diastolic parameters are consistent with Grade I  diastolic dysfunction (impaired relaxation).   3. The left ventricle has no regional wall motion abnormalities.   4. Global right ventricle has normal systolic function.The right  ventricular size is normal.   5. Left atrial size was normal.   6. Right atrial size was normal.   7. The mitral valve is normal in structure. No evidence of mitral valve  regurgitation. No evidence of mitral stenosis.   8. The tricuspid valve is normal in structure. Tricuspid valve  regurgitation is trivial.   9. The aortic valve is normal in structure. Aortic valve regurgitation is  not visualized. No evidence of aortic valve  sclerosis or stenosis.  10. The pulmonic valve was normal in structure. Pulmonic valve  regurgitation is trivial.  11. The inferior vena cava is normal in size with greater than 50%  respiratory variability, suggesting right atrial pressure of 3 mmHg.  12. Normal LV systolic function; grade 1 diastolic dysfunction.   EKG:  EKG is ordered today.  The ekg ordered today demonstrates NSR 91bpm, nonspecific ST changes  Recent Labs: 11/29/2022: ALT 37; BUN 14; Creatinine, Ser 1.11; Hemoglobin 14.4; Platelets 313; Potassium 4.7; Sodium 137; TSH 2.090  Recent Lipid Panel    Component Value Date/Time   CHOL 204 (H) 11/29/2022 0835   TRIG 159 (H) 11/29/2022 0835   HDL 49 11/29/2022 0835   CHOLHDL 4.2 11/29/2022 0835   CHOLHDL 7.0 08/22/2019 0541   VLDL 31 08/22/2019 0541   LDLCALC 127 (H) 11/29/2022 0835    Physical Exam:    VS:  BP 132/88 (BP Location: Left Arm, Patient Position: Sitting)   Pulse 91   Ht 5\' 8"  (1.727 m)   Wt 227 lb 9.6 oz (103.2 kg)   SpO2 97%   BMI 34.61 kg/m     Wt Readings from Last 3 Encounters:  04/11/23 227 lb 9.6 oz (103.2 kg)  02/21/23 215 lb (97.5 kg)  12/06/22 224 lb (101.6 kg)     GEN:  Well nourished, well developed in no acute distress HEENT: Normal NECK: No JVD; No carotid bruits LYMPHATICS: No lymphadenopathy CARDIAC: RRR, no murmurs, rubs, gallops RESPIRATORY:  Clear to auscultation without rales, wheezing or rhonchi  ABDOMEN: Soft, non-tender, non-distended MUSCULOSKELETAL:  No edema; No deformity  SKIN: Warm and dry NEUROLOGIC:  Alert and oriented x 3 PSYCHIATRIC:  Normal affect   ASSESSMENT:    1. Essential hypertension   2. Hyperlipidemia, mixed   3. Alcohol use   4. Cerebrovascular accident (CVA), unspecified mechanism (HCC)    PLAN:    In order of problems listed above:  HTN BP is good today, continue Irbesartan 300mg  daily, we will refill this.   HLD LDL 127, continue Crestor 40mg  daily, we will refill this. Lifestlye  changes recommended.   Alcohol use He is drinking 3 beers daily. He is trying to decrease this.   H/o CVA Continue Aspirin, statin and Lovaza.    Disposition: Follow up in 1 year(s) with MD/APP    Signed, Decarla Siemen David Stall, PA-C  04/11/2023 3:44 PM    [facility]

## 2023-04-12 MED ORDER — IRBESARTAN 300 MG PO TABS: 300.0000 mg | ORAL_TABLET | Freq: Every day | ORAL | 3 refills | Status: DC

## 2023-04-12 NOTE — Addendum Note (Signed)
Addended by: Parke Poisson on: 04/12/2023 09:27 AM   Modules accepted: Orders

## 2023-04-17 ENCOUNTER — Telehealth: Payer: Self-pay | Admitting: Medical

## 2023-04-17 MED ORDER — IRBESARTAN 300 MG PO TABS: 300.0000 mg | ORAL_TABLET | Freq: Every day | ORAL | 3 refills | Status: DC

## 2023-04-17 NOTE — Telephone Encounter (Signed)
Requested Prescriptions   Signed Prescriptions Disp Refills   irbesartan (AVAPRO) 300 MG tablet 90 tablet 3    Sig: Take 1 tablet (300 mg total) by mouth daily.    Authorizing Provider: Marianne Sofia    Ordering User: Kendrick Fries

## 2023-04-17 NOTE — Telephone Encounter (Signed)
*  STAT* If patient is at the pharmacy, call can be transferred to refill team.   1. Which medications need to be refilled? (please list name of each medication and dose if known) Irbesartan- patient said the refill sent in had two medicine on it- jhas to have  only one medicine on it   2. Would you like to learn more about the convenience, safety, & potential cost savings by using the [facility]?    3. Are you open to using the [facility] (Type [facility].    . Which pharmacy/location (including street and city if local pharmacy) is medication to be sent to?  [facility]  [place]   5. Do they need a 30 day or 90 day supply? 90 days and refills

## 2023-06-15 ENCOUNTER — Ambulatory Visit: Payer: Self-pay | Admitting: Physician Assistant

## 2023-06-15 ENCOUNTER — Encounter: Payer: Self-pay | Admitting: Physician Assistant

## 2023-06-15 VITALS — BP 109/75 | HR 96 | Resp 14 | Ht 68.0 in | Wt 222.0 lb

## 2023-06-15 DIAGNOSIS — R1032 Left lower quadrant pain: Secondary | ICD-10-CM

## 2023-06-15 LAB — POCT URINALYSIS DIPSTICK
Bilirubin, UA: NEGATIVE
Blood, UA: NEGATIVE
Glucose, UA: NEGATIVE
Ketones, UA: NEGATIVE
Leukocytes, UA: NEGATIVE
Nitrite, UA: NEGATIVE
Protein, UA: POSITIVE — AB
Spec Grav, UA: >=1.03 — AB (ref 1.010–1.025)
Urobilinogen, UA: 0.2 U/dL
pH, UA: 6 (ref 5.0–8.0)

## 2023-06-15 NOTE — Progress Notes (Signed)
Pt presents today stating he may have a hernia on left side of groin. Pt is experiencing tenderness near left testicle since Tuesday 06/13/23.

## 2023-06-15 NOTE — Progress Notes (Signed)
  Subjective:     Patient ID: Henry Powell, male    DOB: 08-Sep-1981, 42 y.o.   MRN: [id number]  Chief Complaint  Patient presents with   Groin Pain    HPI Patient presents to clinic for evaluation of left sided inguinal pain x 2 days. Reports that pain started after a day of strenuous activity that included repetitive squatting and bending. Reports pain has been a 7/10 at its worse but is 3/10 at the time of this visit. States that ice application and rest has provided partial relief. Denies dysuria, hematuria, fever, or chills.   Review of Systems  Constitutional: Negative.   Respiratory: Negative.    Cardiovascular: Negative.   Gastrointestinal: Negative.   Genitourinary: Negative.   Musculoskeletal:        Left-sided Inguinal pain  Neurological: Negative.   Psychiatric/Behavioral: Negative.          Objective:    BP 109/75   Pulse 96   Resp 14   Ht 5\' 8"  (1.727 m)   Wt 222 lb (100.7 kg)   SpO2 99%   BMI 33.75 kg/m    Physical Exam Constitutional:      Appearance: He is not ill-appearing.  Cardiovascular:     Rate and Rhythm: Normal rate and regular rhythm.  Pulmonary:     Effort: Pulmonary effort is normal.     Breath sounds: Normal breath sounds.  Abdominal:     General: Abdomen is flat. Bowel sounds are normal.     Palpations: Abdomen is soft.  Musculoskeletal:        General: Tenderness present. No swelling. Normal range of motion.     Comments: Tenderness to left inguinal area  Skin:    General: Skin is warm and dry.     Capillary Refill: Capillary refill takes less than 2 seconds.  Neurological:     General: No focal deficit present.     Mental Status: He is alert. Mental status is at baseline.  Psychiatric:        Mood and Affect: Mood normal.        Behavior: Behavior normal.       Assessment & Plan:   Problem List Items Addressed This Visit   None Visit Diagnoses     Left inguinal pain    -  Primary   Relevant Orders   POCT urinalysis  dipstick (Completed)     Patient prescribed 400 mg Aleve BID and educated on need to refrain from use of other NSAIDs when using this medication as prescribed. Advised on need to avoid strenuous tension and stress to the area.  Advised to contact clinic with any concerns or needs.   Follow up in this clinic on 06/19/2023.  Delma Post, RN

## 2023-07-11 ENCOUNTER — Encounter: Payer: Self-pay | Admitting: Physician Assistant

## 2023-07-11 ENCOUNTER — Ambulatory Visit: Payer: Self-pay | Admitting: Physician Assistant

## 2023-07-11 VITALS — BP 123/93 | HR 95 | Temp 97.7°F | Resp 14 | Ht 68.0 in | Wt 215.0 lb

## 2023-07-11 DIAGNOSIS — R0981 Nasal congestion: Secondary | ICD-10-CM

## 2023-07-11 DIAGNOSIS — H6993 Unspecified Eustachian tube disorder, bilateral: Secondary | ICD-10-CM

## 2023-07-11 MED ORDER — METHYLPREDNISOLONE 4 MG PO TBPK: ORAL_TABLET | ORAL | 0 refills | Status: DC

## 2023-07-11 MED ORDER — BENZONATATE 100 MG PO CAPS: 200.0000 mg | ORAL_CAPSULE | Freq: Three times a day (TID) | ORAL | 0 refills | Status: DC | PRN

## 2023-07-11 MED ORDER — CLARITIN-D 12 HOUR 5-120 MG PO TB12: 1.0000 | ORAL_TABLET | Freq: Two times a day (BID) | ORAL | 0 refills | Status: DC

## 2023-07-11 NOTE — Progress Notes (Signed)
left ear clogged/ pressure x 1 day. Pt has had sinus issues over the weekend. runny nose & cough.

## 2023-07-11 NOTE — Progress Notes (Signed)
   Subjective: Sinus congestion and bilateral ear pain.    Patient ID: Henry Powell, male    DOB: 1980-12-28, 42 y.o.   MRN: [id number]  HPI Patient complain onset of sinus congestion and bilateral ear pain yesterday.  Denies fever chills associated complaint.  Denies vertigo.  Denies hearing loss.  No recent travel or known contact with COVID-19.   Review of Systems Anxiety, hyperlipidemia, and hypertension.    Objective:   Physical Exam BP 123/93  Pulse 95  Resp 14  Temp 97.7 F (36.5 C)  Temp src Temporal  SpO2 98 %  Weight 215 lb (97.5 kg)  Height 5\' 8"  (1.727 m)   BMI 32.69 kg/m2  BSA 2.16 m2     HEENT is remarkable bilateral maxillary guarding and edematous nasal turbinates.  Patient is bilateral bulging TMs.  Postnasal drainage. Neck is supple for lymphadenopathy or bruits. Lungs are clear to auscultation. Heart regular rate and rhythm.    Assessment & Plan: Sinus congestion and eustachian tube dysfunction.  Patient given a prescription for Claritin-D, Medrol Dosepak, and Tessalon Perles.  Advised to follow-up if no improvement or worsening complaint.

## 2023-11-16 ENCOUNTER — Other Ambulatory Visit: Payer: Self-pay | Admitting: Physician Assistant

## 2023-11-16 DIAGNOSIS — R21 Rash and other nonspecific skin eruption: Secondary | ICD-10-CM

## 2023-11-16 MED ORDER — VALACYCLOVIR HCL 1 G PO TABS
1000.0000 mg | ORAL_TABLET | Freq: Two times a day (BID) | ORAL | 0 refills | Status: AC
Start: 2023-11-16 — End: ?

## 2023-11-17 ENCOUNTER — Ambulatory Visit: Payer: Self-pay

## 2023-11-17 DIAGNOSIS — Z0000 Encounter for general adult medical examination without abnormal findings: Secondary | ICD-10-CM

## 2023-11-17 LAB — POCT URINALYSIS DIPSTICK
Blood, UA: NEGATIVE
Glucose, UA: NEGATIVE
Ketones, UA: NEGATIVE
Leukocytes, UA: NEGATIVE
Nitrite, UA: NEGATIVE
Protein, UA: NEGATIVE
Spec Grav, UA: 1.025 (ref 1.010–1.025)
Urobilinogen, UA: 0.2 U/dL
pH, UA: 5.5 (ref 5.0–8.0)

## 2023-11-17 NOTE — Progress Notes (Signed)
 Pt completed labs for physical. Gretel Acre

## 2023-11-18 LAB — CMP12+LP+TP+TSH+6AC+PSA+CBC…
ALT: 40 IU/L (ref 0–44)
AST: 20 IU/L (ref 0–40)
Albumin: 4.8 g/dL (ref 4.1–5.1)
Alkaline Phosphatase: 81 IU/L (ref 44–121)
BUN/Creatinine Ratio: 19 (ref 9–20)
BUN: 22 mg/dL (ref 6–24)
Basophils Absolute: 0.1 10*3/uL (ref 0.0–0.2)
Basos: 1 %
Bilirubin Total: 0.4 mg/dL (ref 0.0–1.2)
Calcium: 9.6 mg/dL (ref 8.7–10.2)
Chloride: 100 mmol/L (ref 96–106)
Chol/HDL Ratio: 5.6 ratio — ABNORMAL HIGH (ref 0.0–5.0)
Cholesterol, Total: 219 mg/dL — ABNORMAL HIGH (ref 100–199)
Creatinine, Ser: 1.15 mg/dL (ref 0.76–1.27)
EOS (ABSOLUTE): 0.2 10*3/uL (ref 0.0–0.4)
Eos: 3 %
Estimated CHD Risk: 1.2 times avg. — ABNORMAL HIGH (ref 0.0–1.0)
Free Thyroxine Index: 1.5 (ref 1.2–4.9)
GGT: 66 IU/L — ABNORMAL HIGH (ref 0–65)
Globulin, Total: 2.5 g/dL (ref 1.5–4.5)
Glucose: 106 mg/dL — ABNORMAL HIGH (ref 70–99)
HDL: 39 mg/dL — ABNORMAL LOW (ref >39)
Hematocrit: 42.7 % (ref 37.5–51.0)
Hemoglobin: 14.5 g/dL (ref 13.0–17.7)
Immature Grans (Abs): 0.1 10*3/uL (ref 0.0–0.1)
Immature Granulocytes: 1 %
Iron: 120 ug/dL (ref 38–169)
LDH: 172 IU/L (ref 121–224)
LDL Chol Calc (NIH): 129 mg/dL — ABNORMAL HIGH (ref 0–99)
Lymphocytes Absolute: 2.4 10*3/uL (ref 0.7–3.1)
Lymphs: 34 %
MCH: 30.2 pg (ref 26.6–33.0)
MCHC: 34 g/dL (ref 31.5–35.7)
MCV: 89 fL (ref 79–97)
Monocytes Absolute: 0.6 10*3/uL (ref 0.1–0.9)
Monocytes: 8 %
Neutrophils Absolute: 3.8 10*3/uL (ref 1.4–7.0)
Neutrophils: 53 %
Phosphorus: 3.8 mg/dL (ref 2.8–4.1)
Platelets: 331 10*3/uL (ref 150–450)
Potassium: 4.5 mmol/L (ref 3.5–5.2)
Prostate Specific Ag, Serum: 1.6 ng/mL (ref 0.0–4.0)
RBC: 4.8 x10E6/uL (ref 4.14–5.80)
RDW: 13.8 % (ref 11.6–15.4)
Sodium: 135 mmol/L (ref 134–144)
T3 Uptake Ratio: 24 % (ref 24–39)
T4, Total: 6.2 ug/dL (ref 4.5–12.0)
TSH: 2.61 u[IU]/mL (ref 0.450–4.500)
Total Protein: 7.3 g/dL (ref 6.0–8.5)
Triglycerides: 287 mg/dL — ABNORMAL HIGH (ref 0–149)
Uric Acid: 7.6 mg/dL (ref 3.8–8.4)
VLDL Cholesterol Cal: 51 mg/dL — ABNORMAL HIGH (ref 5–40)
WBC: 7.2 10*3/uL (ref 3.4–10.8)
eGFR: 83 mL/min/{1.73_m2} (ref >59)

## 2023-11-23 ENCOUNTER — Ambulatory Visit: Payer: Self-pay | Admitting: Physician Assistant

## 2023-11-23 ENCOUNTER — Encounter: Payer: Self-pay | Admitting: Physician Assistant

## 2023-11-23 VITALS — BP 120/80 | Temp 97.1°F | Resp 16 | Wt 234.0 lb

## 2023-11-23 DIAGNOSIS — M6740 Ganglion, unspecified site: Secondary | ICD-10-CM

## 2023-11-23 DIAGNOSIS — Z0000 Encounter for general adult medical examination without abnormal findings: Secondary | ICD-10-CM

## 2023-11-23 NOTE — Progress Notes (Signed)
 [facility] [place] [facility]   ____________________________________________   None    (approximate)  I have reviewed the triage vital signs and the nursing notes.   HISTORY  Chief Complaint No chief complaint on file.   HPI Henry Powell is a 43 y.o. male patient presents for annual physical exam.  Voices concern for nodule lesion right wrist.         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 Authorizing Provider  aspirin 81 MG chewable tablet Chew by mouth daily.   Yes [provider]  cyanocobalamin 1000 MCG tablet Take 1 tablet (1,000 mcg total) by mouth daily. 11/14/19  Yes McCue, Shanda Bumps, NP  irbesartan (AVAPRO) 300 MG tablet Take 1 tablet (300 mg total) by mouth daily. 04/17/23  Yes Furth, Cadence H, PA-C  methylPREDNISolone (MEDROL DOSEPAK) 4 MG TBPK tablet Take Tapered dose as directed 07/11/23   Joni Reining, PA-C  Omega 3 1000 MG CAPS Take 1 capsule by mouth daily.    [provider]  rosuvastatin (CRESTOR) 40 MG tablet Take 1 tablet (40 mg total) by mouth daily. 04/11/23   Furth, Cadence H, PA-C  sertraline (ZOLOFT) 100 MG tablet Take 1 tablet (100 mg total) by mouth daily. 03/06/23   Tommi Rumps, PA-C  valACYclovir (VALTREX) 1000 MG tablet Take 1 tablet (1,000 mg total) by mouth 2 (two) times daily. 11/16/23    Joni Reining, PA-C    Allergies Patient has no known allergies.  Family History  Problem Relation Age of Onset   Hypertension Mother    Diabetes Mother    Hypertension Father    Stroke Paternal Uncle    Stroke Paternal Uncle     Social History Social History   Tobacco Use   Smoking status: Never   Smokeless tobacco: Former    Types: Chew    Quit date: 08/18/2019  Substance Use Topics   Alcohol use: Yes    Alcohol/week: 56.0 standard drinks of alcohol    Types: 56 Cans of beer per week   Drug use: Never    Review of Systems Constitutional: No fever/chills Eyes: No visual changes. ENT: No sore throat. Cardiovascular: Denies chest pain. Respiratory: Denies shortness of breath. Gastrointestinal: No abdominal pain.  No nausea, no vomiting.  No diarrhea.  No constipation. Genitourinary: Negative for dysuria. Musculoskeletal: Negative for back pain.  Lesion consistent with ganglion cyst right wrist Skin: Negative for rash. Neurological: Negative for headaches, focal weakness or numbness. Psychiatric: Anxiety. Endocrine: Hyperlipidemia and hypertension.  ____________________________________________   PHYSICAL EXAM:  VITAL SIGNS: BP 120/80  Temp 97.1 F (36.2 C)Temp. 97.1 F (36.2 C). Data is abnormal. Taken on 11/23/23 2:05 PM  Weight 234 lb (106.1 kg)  Resp 16  SpO2 97 %   Other  Vitals   BMI: 35.58 kg/m2  BSA: 2.26 m2   Constitutional: Alert and oriented. Well appearing and in no acute distress. Eyes: Conjunctivae are normal. PERRL. EOMI. Head: Atraumatic. Nose: No congestion/rhinnorhea. Mouth/Throat: Mucous membranes are moist.  Oropharynx non-erythematous. Neck: No stridor.  No cervical spine tenderness to palpation. Hematological/Lymphatic/Immunilogical: No cervical lymphadenopathy. Cardiovascular: Normal rate, regular rhythm. Grossly normal heart sounds.  Good peripheral circulation. Respiratory: Normal respiratory effort.  No retractions. Lungs  CTAB. Gastrointestinal: Soft and nontender. No distention. No abdominal bruits. No CVA tenderness. Genitourinary: Deferred **}Musculoskeletal: No lower extremity tenderness nor edema.  No joint effusions. Neurologic:  Normal speech and language. No gross focal neurologic deficits are appreciated. No gait instability. Skin:  Skin is warm, dry and intact. No rash noted.  Nodular lesion palmar aspect on the right wrist. Psychiatric: Mood and affect are normal. Speech and behavior are normal.  ____________________________________________   LABS          Component Ref Range & Units (hover) 6 d ago 5 mo ago 11 mo ago 1 yr ago 2 yr ago 3 yr ago  Color, UA yellow dark yellow Yellow Light Yellow Light Yellow CM Pale Yellow  Clarity, UA clear clear Clear Clear Clear Clear  Glucose, UA Negative Negative Negative Negative Negative Negative  Bilirubin, UA 1+ Abnormal  neg Negative Negative Negative neg  Ketones, UA neg neg Negative Negative Negative neg  Spec Grav, UA 1.025 >=1.030 Abnormal  >=1.030 Abnormal  1.020 >=1.030 Abnormal  1.015  Blood, UA neg neg Negative Negative Positive CM neg  pH, UA 5.5 6.0 5.5 5.5 6.0 7.0  Protein, UA Negative Positive Abnormal  CM Negative Negative Negative Negative  Urobilinogen, UA 0.2 0.2 0.2 0.2 0.2 0.2  Nitrite, UA neg neg Negative Negative Negative neg  Leukocytes, UA Negative Negative Negative Negative Negative Negative  Appearance      Clear  Odor                   View All Conversations on this Encounter               Component Ref Range & Units (hover) 6 d ago (11/17/23) 11 mo ago (11/29/22) 1 yr ago (05/31/22) 1 yr ago (05/11/22) 1 yr ago (01/31/22) 2 yr ago (04/14/21) 2 yr ago (12/17/20)  Glucose 106 High  102 High    102 High  109 High  R 107 High  R  Uric Acid 7.6 6.5 CM   6.4 CM  6.2 CM  Comment:            Therapeutic target for gout patients: <6.0  BUN 22 14   14  R 17 R 12 R  Creatinine, Ser 1.15 1.11   1.21 1.11 1.22  eGFR 83 86   78  87 78  BUN/Creatinine Ratio 19 13   12 15 10   Sodium 135 137   137 137 139  Potassium 4.5 4.7   4.9 5.1 4.5  Chloride 100 100   100 98 101  Calcium 9.6 9.4   9.9 10.2 9.6  Phosphorus 3.8 3.3   3.6  3.6  Total Protein 7.3 7.5   7.3  6.9  Albumin 4.8 5.1   4.8 R  4.7 R  Globulin, Total 2.5 2.4   2.5  2.2  Bilirubin Total 0.4 0.4   0.3  0.4  Alkaline Phosphatase 81 80   89  90  LDH 172 179   175  178  AST 20  21   25  34  ALT 40 37   52 High   54 High   GGT 66 High  36   51  45  Iron 120 88   85  84  Cholesterol, Total 219 High  204 High  216 High  267 High  217 High   165  Triglycerides 287 High  159 High  181 High  245 High  165 High   145  HDL 39 Low  49 43 44 45  44  VLDL Cholesterol Cal 51 High  28 33 47 High  30  26  LDL Chol Calc (NIH) 129 High  127 High  140 High  176 High  142 High   95  Chol/HDL Ratio 5.6 High  4.2 CM 5.0 CM 6.1 High  CM 4.8 CM  3.8 CM  Comment:                                   T. Chol/HDL Ratio                                             Men  Women                               1/2 Avg.Risk  3.4    3.3                                   Avg.Risk  5.0    4.4                                2X Avg.Risk  9.6    7.1                                3X Avg.Risk 23.4   11.0  Estimated CHD Risk 1.2 High  0.8 CM   1.0 CM  0.7 CM  Comment: The CHD Risk is based on the T. Chol/HDL ratio. Other factors affect CHD Risk such as hypertension, smoking, diabetes, severe obesity, and family history of premature CHD.  TSH 2.610 2.090   1.560  2.100  T4, Total 6.2 5.7   5.3  6.0  T3 Uptake Ratio 24 27   22  Low   27  Free Thyroxine Index 1.5 1.5   1.2  1.6  Prostate Specific Ag, Serum 1.6 0.5 CM       Comment: Roche ECLIA methodology. According to the American Urological Association, Serum PSA should decrease and remain at undetectable levels after radical prostatectomy. The AUA defines biochemical recurrence as an initial PSA value 0.2 ng/mL or greater followed by a  subsequent confirmatory PSA value 0.2 ng/mL or greater. Values obtained with different assay methods or kits cannot be used interchangeably. Results cannot be interpreted as absolute evidence of the presence or absence of malignant disease.  WBC 7.2 6.3   7.5  5.9  RBC 4.80 4.85   4.75  4.70  Hemoglobin 14.5 14.4   14.2  14.0  Hematocrit 42.7 42.6   40.9  41.2  MCV 89 88   86  88  MCH 30.2 29.7   29.9  29.8  MCHC 34.0 33.8   34.7  34.0  RDW 13.8 13.9   13.2  13.8  Platelets 331 313   331  278  Neutrophils 53 54   53  55  Lymphs 34 36   35  33  Monocytes 8 7   8  8   Eos 3 2   3  3   Basos 1 1   1  1   Neutrophils Absolute 3.8 3.4   4.0  3.3  Lymphocytes Absolute 2.4 2.2   2.6  1.9  Monocytes Absolute 0.6 0.5   0.6  0.5  EOS (ABSOLUTE) 0.2 0.1   0.2  0.2  Basophils Absolute 0.1 0.0   0.1  0.0  Immature Granulocytes 1 0   0  0  Immature Grans (Abs) 0.1 0.0   0.0  0.0              ____________________________________________  EKG  Sinus rhythm at 78 bpm.  Nonspecific T wave abnormality.  Asymptomatic. ____________________________________________    ____________________________________________   INITIAL IMPRESSION / ASSESSMENT AND PLAN   As part of my medical decision making, I reviewed the following data within the electronic MEDICAL RECORD NUMBER      No acute findings on physical exam or EKG.  Labs reveal mixed hyperlipidemia.  Patient will return in 3 months for lipid profiles        ____________________________________________   FINAL CLINICAL IMPRESSION    ED Discharge Orders     None        Note:  This document was prepared using Dragon voice recognition software and may include unintentional dictation errors.

## 2023-11-23 NOTE — Progress Notes (Signed)
 Here for yearly physical with no complaints voiced.  Stated he's aware of labs and eats sausage biscuit almost daily.

## 2024-02-19 ENCOUNTER — Other Ambulatory Visit: Payer: Self-pay

## 2024-02-19 DIAGNOSIS — F411 Generalized anxiety disorder: Secondary | ICD-10-CM

## 2024-02-19 MED ORDER — SERTRALINE HCL 100 MG PO TABS: 100.0000 mg | ORAL_TABLET | Freq: Every day | ORAL | 3 refills | Status: AC

## 2024-03-13 ENCOUNTER — Other Ambulatory Visit: Payer: Self-pay

## 2024-03-13 DIAGNOSIS — E782 Mixed hyperlipidemia: Secondary | ICD-10-CM

## 2024-03-14 LAB — LIPID PANEL WITH LDL/HDL RATIO
Cholesterol, Total: 165 mg/dL (ref 100–199)
HDL: 31 mg/dL — ABNORMAL LOW (ref >39)
LDL Chol Calc (NIH): 102 mg/dL — ABNORMAL HIGH (ref 0–99)
LDL/HDL Ratio: 3.3 ratio (ref 0.0–3.6)
Triglycerides: 184 mg/dL — ABNORMAL HIGH (ref 0–149)
VLDL Cholesterol Cal: 32 mg/dL (ref 5–40)

## 2024-04-23 NOTE — Telephone Encounter (Signed)
Patient is following up regarding refill request.

## 2024-04-23 NOTE — Telephone Encounter (Signed)
 Patient is following up regarding refill request.

## 2024-04-24 NOTE — Telephone Encounter (Signed)
 For review

## 2024-04-30 ENCOUNTER — Telehealth: Payer: Self-pay | Admitting: Medical

## 2024-04-30 MED ORDER — IRBESARTAN 300 MG PO TABS: 300.0000 mg | ORAL_TABLET | Freq: Every day | ORAL | 0 refills | Status: DC

## 2024-04-30 NOTE — Telephone Encounter (Signed)
*  STAT* If patient is at the pharmacy, call can be transferred to refill team.   1. Which medications need to be refilled? (please list name of each medication and dose if known) irbesartan  (AVAPRO ) 300 MG tablet    2. Would you like to learn more about the convenience, safety, & potential cost savings by using the [facility]? No     3. Are you open to using the [facility] (Type [facility]. No ).   4. Which pharmacy/location (including street and city if local pharmacy) is medication to be sent to?[id number] - [place], [place] - [address] AT [facility] OF [address]   5. Do they    need a 30 day or 90 day supply? 90 day  Pt would like a c/b once the request has been sent in

## 2024-04-30 NOTE — Telephone Encounter (Signed)
 RX sent in

## 2024-05-24 ENCOUNTER — Other Ambulatory Visit: Payer: Self-pay | Admitting: Medical

## 2024-05-24 DIAGNOSIS — E7801 Familial hypercholesterolemia: Secondary | ICD-10-CM

## 2024-05-24 MED ORDER — ROSUVASTATIN CALCIUM 40 MG PO TABS: 40.0000 mg | ORAL_TABLET | Freq: Every day | ORAL | 0 refills | Status: DC

## 2024-05-27 ENCOUNTER — Ambulatory Visit: Attending: Medical | Admitting: Medical

## 2024-05-27 ENCOUNTER — Encounter: Payer: Self-pay | Admitting: Medical

## 2024-05-27 VITALS — BP 100/68 | HR 92 | Ht 68.0 in | Wt 237.0 lb

## 2024-05-27 DIAGNOSIS — F1090 Alcohol use, unspecified, uncomplicated: Secondary | ICD-10-CM | POA: Diagnosis not present

## 2024-05-27 DIAGNOSIS — I10 Essential (primary) hypertension: Secondary | ICD-10-CM | POA: Diagnosis not present

## 2024-05-27 DIAGNOSIS — E7801 Familial hypercholesterolemia: Secondary | ICD-10-CM | POA: Diagnosis not present

## 2024-05-27 DIAGNOSIS — I639 Cerebral infarction, unspecified: Secondary | ICD-10-CM

## 2024-05-27 MED ORDER — ROSUVASTATIN CALCIUM 40 MG PO TABS: 40.0000 mg | ORAL_TABLET | Freq: Every day | ORAL | 3 refills | Status: AC

## 2024-05-27 MED ORDER — IRBESARTAN 300 MG PO TABS: 300.0000 mg | ORAL_TABLET | Freq: Every day | ORAL | 3 refills | Status: AC

## 2024-05-27 NOTE — Progress Notes (Signed)
 Cardiology Office Note   Date:  05/27/2024  ID:  Dustyn Dansereau, DOB [birth year], MRN [id number] PCP: Patient, No Pcp Per  [facility] Providers Cardiologist:  Redell Cave, MD   History of Present Illness Henry Powell is a 43 y.o. male with a hx of HTN, HLD, CVA 2020, and ETOH use who presents for follow-up for HTN.   The patient has a h/o of HTN started on clonidine  TID but diastolic pressures were still high. He had a stroke in 2020, CT head showed right PICA infarct. Echo 08/2019 showed normal systolic function. EF 50-60%, impaired relaxation.   Patient was last seen 04/11/2023 and was overall doing well.  He reported drinking 3 beers daily.  Blood pressure was controlled.  Today, the patient is overall doing well. He denies chest pain, SOB, lower leg edema. He has rare lightheadedness and dizziness, but he is getting over a cold. He only drinks alcohol on the weekends, he will drink non-alcoholic drinks as well. Mother passed away in 02/18/2024. He has been working on healthy lifestyle changes.   Studies Reviewed EKG Interpretation Date/Time:  Monday May 27 2024 09:11:59 EDT Ventricular Rate:  92 PR Interval:  140 QRS Duration:  88 QT Interval:  344 QTC Calculation: 425 R Axis:   68  Text Interpretation: Normal sinus rhythm Nonspecific T wave abnormality When compared with ECG of 11-Apr-2023 15:26, No significant change was found Confirmed by Franchester, Lashunda Greis ([id number]) on 05/27/2024 9:18:36 AM    Echo 08/2019  1. Left ventricular ejection fraction, by visual estimation, is 55 to  60%. The left ventricle has normal function. There is no left ventricular  hypertrophy.   2. Left ventricular diastolic parameters are consistent with Grade I  diastolic dysfunction (impaired relaxation).   3. The left ventricle has no regional wall motion abnormalities.   4. Global right ventricle has normal systolic function.The right  ventricular size is normal.   5. Left atrial size was  normal.   6. Right atrial size was normal.   7. The mitral valve is normal in structure. No evidence of mitral valve  regurgitation. No evidence of mitral stenosis.   8. The tricuspid valve is normal in structure. Tricuspid valve  regurgitation is trivial.   9. The aortic valve is normal in structure. Aortic valve regurgitation is  not visualized. No evidence of aortic valve sclerosis or stenosis.  10. The pulmonic valve was normal in structure. Pulmonic valve  regurgitation is trivial.  11. The inferior vena cava is normal in size with greater than 50%  respiratory variability, suggesting right atrial pressure of 3 mmHg.  12. Normal LV systolic function; grade 1 diastolic dysfunction.      Physical Exam VS:  BP 100/68   Pulse 92   Ht 5' 8 (1.727 m)   Wt 237 lb (107.5 kg)   SpO2 95%   BMI 36.04 kg/m        Wt Readings from Last 3 Encounters:  05/27/24 237 lb (107.5 kg)  11/23/23 234 lb (106.1 kg)  07/11/23 215 lb (97.5 kg)    GEN: Well nourished, well developed in no acute distress NECK: No JVD; No carotid bruits CARDIAC: RRR, no murmurs, rubs, gallops RESPIRATORY:  Clear to auscultation without rales, wheezing or rhonchi  ABDOMEN: Soft, non-tender, non-distended EXTREMITIES:  No edema; No deformity   ASSESSMENT AND PLAN  HTN Blood pressure today 100/68.  Continue Avapro  300 mg daily.  We will send in 90-day refills of medication.  HLD  LDL 102. Continue Crestor  40mg  daily.   Alcohol use He has greatly reduced alcohol use. He only drinks on the weekends and has been drinking nonalcoholic beer.  H/o CVA Continue ASA, statin and Lovaza .         Dispo: follow-up in 1 year  Signed, Oshea Percival VEAR Fishman, PA-C

## 2024-05-27 NOTE — Patient Instructions (Signed)
 Medication Instructions:  Your physician recommends that you continue on your current medications as directed. Please refer to the Current Medication list given to you today.    *If you need a refill on your cardiac medications before your next appointment, please call your pharmacy*  Lab Work: No labs ordered today    Testing/Procedures: No test ordered today   Follow-Up: At [facility], you and your health needs are our priority.  As part of our continuing mission to provide you with exceptional heart care, our providers are all part of one team.  This team includes your primary Cardiologist (physician) and Advanced Practice Providers or APPs (Physician Assistants and Nurse Practitioners) who all work together to provide you with the care you need, when you need it.  Your next appointment:   1 year(s)  Provider:   Redell Cave, MD or Cadence Franchester, PA-C

## 2024-09-24 ENCOUNTER — Ambulatory Visit: Payer: Self-pay | Admitting: Family

## 2024-09-24 DIAGNOSIS — S91332A Puncture wound without foreign body, left foot, initial encounter: Secondary | ICD-10-CM

## 2024-09-24 NOTE — Progress Notes (Signed)
 When Henry Powell was hired at the [employer] in 2019, he reported that his last Tdap was within 4 years (around 2015). He has not had a Tdap from the [place] [facility] - electronic record reviewed & paper chart in the clinic reviewed. Also, reviewed [facility] & he was not listed in it.

## 2024-09-24 NOTE — Progress Notes (Signed)
 Tdap updated after consent filed and lot [id number], GlaxoSmithKlein in LD and tolerated well.  No sign of puncure on bottom of left foot as reported from last week.  Reportedly washed and kept clean immediately.

## 2024-09-24 NOTE — Progress Notes (Signed)
" °  Subjective:     Patient ID: Henry Powell, male   DOB: July 01, 1981, 44 y.o.   MRN: [id number]  HPI stepped on nail with left foot     Objective:   Physical Exam Feet:     Comments: Barely visible puncture wound to base of foot just below 1st toe.  No redness or bleeding. No s/s infection.        Assessment:     Puncture wound to bottom of left foot    Plan:     Updated Tetanus vaccine in clinic.   RTC if s/s infection appear.     "

## 2024-11-14 ENCOUNTER — Encounter: Admitting: Physician Assistant
